# Patient Record
Sex: Female | Born: 1985 | Race: Black or African American | Hispanic: No | Marital: Married | Smoking: Never smoker
Health system: Southern US, Community
[De-identification: ages and names within clinical notes are randomized; demographics above are authoritative.]

## PROBLEM LIST (undated history)

## (undated) DIAGNOSIS — Z789 Other specified health status: Secondary | ICD-10-CM

## (undated) DIAGNOSIS — A0472 Enterocolitis due to Clostridium difficile, not specified as recurrent: Secondary | ICD-10-CM

## (undated) HISTORY — DX: Other specified health status: Z78.9

## (undated) HISTORY — PX: NO PAST SURGERIES: SHX2092

---

## 2014-05-19 ENCOUNTER — Observation Stay: Payer: Self-pay | Admitting: Obstetrics and Gynecology

## 2014-05-20 ENCOUNTER — Inpatient Hospital Stay: Payer: Self-pay

## 2014-07-16 NOTE — H&P (Signed)
L&D Evaluation:  History:  Presents with contractions, leaking fluid   Patient's Medical History No Chronic Illness   Patient's Surgical History none   Medications Pre Natal Vitamins  Tylenol (Acetaminophen)  aciphex   Allergies NKDA   Social History none   Family History Non-Contributory   ROS:  ROS All systems were reviewed.  HEENT, CNS, GI, GU, Respiratory, CV, Renal and Musculoskeletal systems were found to be normal.   Exam:  Vital Signs stable   Urine Protein not completed   General no apparent distress   Mental Status clear   Chest clear   Heart normal sinus rhythm   Abdomen gravid, tender with contractions   Estimated Fetal Weight Average for gestational age   Fetal Position vtx   Edema no edema   Pelvic no external lesions, 6.5/80/-2   Mebranes Ruptured   Description bloody   FHT normal rate with no decels   Fetal Heart Rate 144   Ucx regular   Ucx Frequency 3 min   Length of each Contraction 50 seconds   Ucx Pain Scale 8   Skin dry   Impression:  Impression active labor   Plan:  Plan monitor contractions and for cervical change, fluids, epidural   Electronic Signatures: Trudee Kuster, Melody N (CNM)  (Signed 14-Mar-16 21:58)  Authored: L&D Evaluation   Last Updated: 14-Mar-16 21:58 by Evonnie Pat (CNM)

## 2014-11-04 ENCOUNTER — Encounter: Payer: Self-pay | Admitting: *Deleted

## 2014-11-07 LAB — HM PAP SMEAR: HM Pap smear: NORMAL

## 2014-11-08 ENCOUNTER — Encounter: Payer: Self-pay | Admitting: Obstetrics and Gynecology

## 2014-11-12 ENCOUNTER — Encounter: Payer: Self-pay | Admitting: Obstetrics and Gynecology

## 2014-11-12 ENCOUNTER — Other Ambulatory Visit: Payer: Self-pay | Admitting: Obstetrics and Gynecology

## 2014-11-12 ENCOUNTER — Ambulatory Visit (INDEPENDENT_AMBULATORY_CARE_PROVIDER_SITE_OTHER): Payer: Managed Care, Other (non HMO) | Admitting: Obstetrics and Gynecology

## 2014-11-12 VITALS — BP 111/83 | HR 81 | Ht 63.0 in | Wt 129.6 lb

## 2014-11-12 DIAGNOSIS — N911 Secondary amenorrhea: Secondary | ICD-10-CM

## 2014-11-12 DIAGNOSIS — N941 Dyspareunia: Secondary | ICD-10-CM | POA: Diagnosis not present

## 2014-11-12 DIAGNOSIS — Z01419 Encounter for gynecological examination (general) (routine) without abnormal findings: Secondary | ICD-10-CM | POA: Diagnosis not present

## 2014-11-12 DIAGNOSIS — IMO0002 Reserved for concepts with insufficient information to code with codable children: Secondary | ICD-10-CM

## 2014-11-12 MED ORDER — NORETHINDRONE 0.35 MG PO TABS: 1.0000 | ORAL_TABLET | Freq: Every day | ORAL | Status: DC

## 2014-11-12 NOTE — Patient Instructions (Addendum)
Place annual gynecologic exam patient instructions here.  Thank you for enrolling in [facility]. Please follow the instructions below to securely access your online medical record. MyChart allows you to send messages to your doctor, view your test results, manage appointments, and more.   How Do I Sign Up? 1. In your Internet browser, go to [employer] and enter [email or web address]. 2. Click on the Sign Up Now link in the Sign In box. You will see the New Member Sign Up page. 3. Enter your MyChart Access Code exactly as it appears below. You will not need to use this code after you've completed the sign-up process. If you do not sign up before the expiration date, you must request a new code.  MyChart Access Code: [email or web address] Expires: 01/11/2015  8:48 AM  4. Enter your Social Security Number ([id number]) and Date of Birth (mm/dd/yyyy) as indicated and click Submit. You will be taken to the next sign-up page. 5. Create a MyChart ID. This will be your MyChart login ID and cannot be changed, so think of one that is secure and easy to remember. 6. Create a MyChart password. You can change your password at any time. 7. Enter your Password Reset Question and Answer. This can be used at a later time if you forget your password.  8. Enter your e-mail address. You will receive e-mail notification when new information is available in [facility]. 9. Click Sign Up. You can now view your medical record.   Additional Information Remember, MyChart is NOT to be used for urgent needs. For medical emergencies, dial 911.   Dyspareunia Dyspareunia is pain during sexual intercourse. It is most common in women, but it also happens in men.  CAUSES  Female The pain from this condition is usually felt when anything is put into the vagina, but any part of the genitals may cause pain during sex. Even sitting or wearing pants can cause pain. Sometimes, a cause cannot be found. Some causes of pain  during intercourse are:  Infections of the skin around the vagina.  Vaginal infections, such as a yeast, bacterial, or viral infection.  Vaginismus. This is the inability to have anything put in the vagina even when the woman wants it to happen. There is an automatic muscle contraction and pain. The pain of the muscle contraction can be so severe that intercourse is impossible.  Allergic reaction from spermicides, semen, condoms, scented tampons, soaps, douches, and vaginal sprays.  A fluid-filled sac (cyst) on the Bartholin or Skene glands, located at the opening of the vagina.  Scar tissue in the vagina from a surgically enlarged opening (episiotomy) or tearing after delivering a baby.  Vaginal dryness. This is more common in menopause. The normal secretions of the vagina are decreased. Changes in estrogen levels and increased difficulty becoming aroused can cause painful sex. Vaginal dryness can also happen when taking birth control pills.  Thinning of the tissue (atrophy) of the vulva and vagina. This makes the area thinner, smaller, unable to stretch to accommodate a penis, and prone to infection and tearing.  Vulvar vestibulitis or vestibulodynia.This is a condition that causes pain involving the area around the entrance to the vagina.The most common cause in young women is birth control pills.Women with low estrogen levels (postmenopausal women) may also experience this.Other causes include allergic reactions, too many nerve endings, skin conditions, and pelvic muscles that cannot relax.  Vulvar dermatoses. This includes skin conditions such as lichen sclerosus and lichen planus.  Lack of  foreplay to lubricate the vagina. This can cause vaginal dryness.  Noncancerous tumors (fibroids) in the uterus.  Uterus lining tissue growing outside the uterus (endometriosis).  Pregnancy that starts in the fallopian tube (tubal pregnancy).  Pregnancy or breastfeeding your baby. This can  cause vaginal dryness.  A tilting or prolapse of the uterus. Prolapse is when weak and stretched muscles around the uterus allow it to fall into the vagina.  Problems with the ovaries, cysts, or scar tissue. This may be worse with certain sexual positions.  Previous surgeries causing adhesions or scar tissue in the vagina or pelvis.  Bladder and intestinal problems.  Psychological problems (such as depression or anxiety). This may make pain worse.  Negative attitudes about sex, experiencing rape, sexual assault, and misinformation about sex. These issues are often related to some types of pain.  Previous pelvic infection, causing scar tissue in the pelvis and on the female organs.  Cyst or tumor on the ovary.  Cancer of the female organs.  Certain medicines.  Medical problems such as diabetes, arthritis, or thyroid disease. Female In men, there are many physical causes of sexual discomfort. Some causes of pain during intercourse are:  Infections of the prostate, bladder, or seminal vesicles. This can cause pain after ejaculation.  An inflamed bladder (interstitial cystitis). This may cause pain from ejaculation.  Gonorrheal infections. This may cause pain during ejaculation.  An inflamed urethra (urethritis) or inflamed prostate (prostatitis). This can make genital stimulation painful or uncomfortable.  Deformities of the penis, such as Peyronie's disease.  A tight foreskin.  Cancer of the female organs.  Psychological problems. This may make pain worse. DIAGNOSIS   Your caregiver will take a history and have you describe where the pain is located (outside the vagina, in the vagina, in the pelvis). You may be asked when you experience pain, such as with penetration or with thrusting.  Following this, your caregiver will do a physical exam. Let your caregiver know if the exam is too painful.  During the final part of the female exam, your caregiver will feel your uterus and  ovaries with one hand on the abdomen and one finger in your vagina. This is a pelvic exam.  Blood tests, a Pap test, cultures for infection, an ultrasound test, and X-rays may be done. You may need to see a specialist for female problems (gynecologist).  Your caregiver may do a CT scan, MRI, or laparoscopy. Laparoscopy is a procedure to look into the pelvis with a lighted tube, through a cut (incision) in the abdomen. TREATMENT  Your caregiver can help you determine the best course of treatment. Sometimes, more testing is done. Continue with the suggested testing until your caregiver feels sure about your diagnosis and how to treat it. Sometimes, it is difficult to find the reason for the pain. The search for the cause and treatment can be frustrating. Treatment often takes several weeks to a few months before you notice any improvement. You may also need to avoid sexual activity until symptoms improve.Continuing to have sex when it hurts can delay healing and actually make the problem worse. The treatment depends on the cause of the pain. Treatment may include:  Medicines such as antibiotics, vaginal or skin creams, hormones, or antidepressants.  Minor or major surgery.  Psychological counseling or group therapy.  Kegel exercises and vaginal dilators to help certain cases of vaginismus (spasms). Do this only if recommended by your caregiver.Kegel exercises can make some problems worse.  Applying  lubrication as recommended by your caregiver if you have dryness.  Sex therapy for you and your sex partner. It is common for the pain to continue after the reason for the pain has been treated. Some reasons for this include a conditioned response. This means the person having the pain becomes so familiar with the pain that the pain continues as a response, even though the cause is removed. Sex therapy can help with this problem. HOME CARE INSTRUCTIONS   Follow your caregiver's instructions about  taking medicines, tests, counseling, and follow-up treatment.  Do not use scented tampons, douches, vaginal sprays, or soaps.  Use water-based lubricants for dryness. Oil lubricants can cause irritation.  Do not use spermicides or condoms that irritate you.  Openly discuss with your partner your sexual experience, your desires, foreplay, and different sexual positions for a more comfortable and enjoyable sexual relationship.  Join group sessions for therapy, if needed.  Practice safe sex at all times.  Empty your bladder before having intercourse.  Try different positions during sexual intercourse.  Take over-the-counter pain medicine recommended by your caregiver before having sexual intercourse.  Do not wear pantyhose. Knee-high and thigh-high hose are okay.  Avoid scrubbing your vulva with a washcloth. Wash the area gently and pat dry with a towel. SEEK MEDICAL CARE IF:   You develop vaginal bleeding after sexual intercourse.  You develop a lump at the opening of your vagina, even if it is not painful.  You have abnormal vaginal discharge.  You have vaginal dryness.  You have itching or irritation of the vulva or vagina.  You develop a rash or reaction to your medicine. SEEK IMMEDIATE MEDICAL CARE IF:   You develop severe abdominal pain during or shortly after sexual intercourse. You could have a ruptured ovarian cyst or ruptured tubal pregnancy.  You have a fever.  You have painful or bloody urination.  You have painful sexual intercourse, and you never had it before.  You pass out after having sexual intercourse. Document Released: 03/14/2007 Document Revised: 05/17/2011 Document Reviewed: 05/25/2010 [facility] Patient Information 2015 [place], [place]. This information is not intended to replace advice given to you by your health care provider. Make sure you discuss any questions you have with your health care provider.

## 2014-11-12 NOTE — Progress Notes (Signed)
  Subjective:     Renee Reyes is a 29 y.o. female and is here for a comprehensive physical exam. The patient reports pain at introitus with sex since delivery of infant, amenorrhea secondary to breastfeeding..  Social History   Social History  . Marital Status: Married    Spouse Name: N/A  . Number of Children: N/A  . Years of Education: N/A   Occupational History  . Not on file.   Social History Main Topics  . Smoking status: Never Smoker   . Smokeless tobacco: Never Used  . Alcohol Use: No  . Drug Use: No  . Sexual Activity: Yes    Birth Control/ Protection: Pill   Other Topics Concern  . Not on file   Social History Narrative   Health Maintenance  Topic Date Due  . HIV Screening  06/13/2002  . TETANUS/TDAP  06/13/2006  . PAP SMEAR  06/12/2008  . INFLUENZA VACCINE  10/07/2014    The following portions of the patient's history were reviewed and updated as appropriate: allergies, current medications, past family history, past medical history, past social history, past surgical history and problem list.  Review of Systems A comprehensive review of systems was negative.   Objective:    General appearance: alert, cooperative and appears stated age Neck: no adenopathy, no carotid bruit, no JVD, supple, symmetrical, trachea midline and thyroid not enlarged, symmetric, no tenderness/mass/nodules Lungs: clear to auscultation bilaterally Breasts: normal appearance, no masses or tenderness Heart: regular rate and rhythm, S1, S2 normal, no murmur, click, rub or gallop Abdomen: soft, non-tender; bowel sounds normal; no masses,  no organomegaly Pelvic: cervix normal in appearance, external genitalia normal, no adnexal masses or tenderness, no cervical motion tenderness, rectovaginal septum normal, uterus normal size, shape, and consistency and vagina normal without discharge    Assessment:    Healthy female exam. 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    See After Visit Summary for Counseling Recommendations

## 2014-11-13 LAB — CYTOLOGY - PAP

## 2014-11-14 ENCOUNTER — Encounter: Payer: Self-pay | Admitting: Obstetrics and Gynecology

## 2014-11-14 ENCOUNTER — Encounter: Payer: Self-pay | Admitting: *Deleted

## 2015-02-11 ENCOUNTER — Telehealth: Payer: Self-pay | Admitting: Obstetrics and Gynecology

## 2015-02-11 NOTE — Telephone Encounter (Signed)
Pt called and for the past 10 days she has had diarrhea and cramping, she is still breast feeding and she has not had a period since she has had the baby, she had her baby march 15, she wanted to know what she can take for the diarrhea while she is still breast feeding and if its normal about not having her period for this long.

## 2015-02-12 NOTE — Telephone Encounter (Signed)
Spoke w/pt. °

## 2015-02-12 NOTE — Telephone Encounter (Signed)
pls advise

## 2015-02-12 NOTE — Telephone Encounter (Signed)
OTC immodium is safe, and yes, most women do not have a menstrual cycle while breastfeeding.

## 2015-02-15 ENCOUNTER — Emergency Department
Admission: EM | Admit: 2015-02-15 | Discharge: 2015-02-15 | Disposition: A | Discharge status: Home / Self Care | Payer: Managed Care, Other (non HMO) | Attending: Emergency Medicine | Admitting: Emergency Medicine

## 2015-02-15 DIAGNOSIS — Z79899 Other long term (current) drug therapy: Secondary | ICD-10-CM | POA: Insufficient documentation

## 2015-02-15 DIAGNOSIS — A047 Enterocolitis due to Clostridium difficile: Secondary | ICD-10-CM | POA: Diagnosis not present

## 2015-02-15 DIAGNOSIS — R1084 Generalized abdominal pain: Secondary | ICD-10-CM

## 2015-02-15 DIAGNOSIS — R109 Unspecified abdominal pain: Secondary | ICD-10-CM | POA: Diagnosis present

## 2015-02-15 DIAGNOSIS — Z3202 Encounter for pregnancy test, result negative: Secondary | ICD-10-CM | POA: Diagnosis not present

## 2015-02-15 DIAGNOSIS — A0472 Enterocolitis due to Clostridium difficile, not specified as recurrent: Secondary | ICD-10-CM

## 2015-02-15 LAB — COMPREHENSIVE METABOLIC PANEL
ALBUMIN: 4 g/dL (ref 3.5–5.0)
ALK PHOS: 84 U/L (ref 38–126)
ALT: 21 U/L (ref 14–54)
AST: 25 U/L (ref 15–41)
Anion gap: 6 (ref 5–15)
BILIRUBIN TOTAL: 1 mg/dL (ref 0.3–1.2)
BUN: 8 mg/dL (ref 6–20)
CALCIUM: 9 mg/dL (ref 8.9–10.3)
CO2: 25 mmol/L (ref 22–32)
Chloride: 105 mmol/L (ref 101–111)
Creatinine, Ser: 0.64 mg/dL (ref 0.44–1.00)
GLUCOSE: 118 mg/dL — AB (ref 65–99)
Potassium: 3.5 mmol/L (ref 3.5–5.1)
SODIUM: 136 mmol/L (ref 135–145)
Total Protein: 7.2 g/dL (ref 6.5–8.1)

## 2015-02-15 LAB — URINALYSIS COMPLETE WITH MICROSCOPIC (ARMC ONLY)
Bilirubin Urine: NEGATIVE
GLUCOSE, UA: NEGATIVE mg/dL
Hgb urine dipstick: NEGATIVE
Leukocytes, UA: NEGATIVE
Nitrite: NEGATIVE
Protein, ur: NEGATIVE mg/dL
Specific Gravity, Urine: 1.004 — ABNORMAL LOW (ref 1.005–1.030)
pH: 6 (ref 5.0–8.0)

## 2015-02-15 LAB — CBC
HEMATOCRIT: 41 % (ref 35.0–47.0)
HEMOGLOBIN: 13.8 g/dL (ref 12.0–16.0)
MCH: 29.1 pg (ref 26.0–34.0)
MCHC: 33.8 g/dL (ref 32.0–36.0)
MCV: 86.1 fL (ref 80.0–100.0)
Platelets: 272 10*3/uL (ref 150–440)
RBC: 4.76 MIL/uL (ref 3.80–5.20)
RDW: 13.6 % (ref 11.5–14.5)
WBC: 14.3 10*3/uL — ABNORMAL HIGH (ref 3.6–11.0)

## 2015-02-15 LAB — POCT PREGNANCY, URINE: PREG TEST UR: NEGATIVE

## 2015-02-15 LAB — LIPASE, BLOOD: LIPASE: 17 U/L (ref 11–51)

## 2015-02-15 MED ORDER — ACETAMINOPHEN 325 MG PO TABS
650.0000 mg | ORAL_TABLET | Freq: Once | ORAL | Status: AC
  Administered 2015-02-15: 650 mg via ORAL
  Filled 2015-02-15: qty 2

## 2015-02-15 NOTE — ED Provider Notes (Signed)
[facility] Emergency Department Provider Note    ____________________________________________  Time seen: 1400  I have reviewed the triage vital signs and the nursing notes.   HISTORY  Chief Complaint Abdominal Pain and Diarrhea   History limited by: Not Limited   HPI Renee Reyes is a 29 y.o. female who presents to the emergency department today because of concerns for abdominal pain in the setting of C. difficile. The patient states that she was recently diagnosed with C. difficile. She states she's been having multiple episodes of loose stool. She states that she has been having abdominal pain for roughly 1 week. It has gotten worse in the past couple of days. She has not tried taking any medication for the pain given that she is breast-feeding. She states she has been on vancomycin for roughly 24 hours at this point. She denies any fevers.     No past medical history on file.  There are no active problems to display for this patient.   No past surgical history on file.  Current Outpatient Rx  Name  Route  Sig  Dispense  Refill  . norethindrone (MICRONOR,CAMILA,ERRIN) 0.35 MG tablet   Oral   Take 1 tablet (0.35 mg total) by mouth daily.   3 Package   3     Allergies Review of patient's allergies indicates no known allergies.  Family History  Problem Relation Age of Onset  . Diabetes Mother     Social History Social History  Substance Use Topics  . Smoking status: Never Smoker   . Smokeless tobacco: Never Used  . Alcohol Use: No    Review of Systems  Constitutional: Negative for fever. Cardiovascular: Negative for chest pain. Respiratory: Negative for shortness of breath. Gastrointestinal: Positive for abdominal pain and diarrhea Genitourinary: Negative for dysuria. Musculoskeletal: Negative for back pain. Skin: Negative for rash. Neurological: Negative for headaches, focal weakness or numbness.  10-point ROS  otherwise negative.  ____________________________________________   PHYSICAL EXAM:  VITAL SIGNS: ED Triage Vitals  Enc Vitals Group     BP 02/15/15 1320 117/78 mmHg     Pulse Rate 02/15/15 1320 77     Resp 02/15/15 1320 16     Temp 02/15/15 1320 98.6 F (37 C)     Temp Source 02/15/15 1320 Oral     SpO2 02/15/15 1320 99 %     Weight 02/15/15 1320 129 lb (58.514 kg)     Height 02/15/15 1320 5\' 3"  (1.6 m)     Head Cir --      Peak Flow --      Pain Score 02/15/15 1343 8   Constitutional: Alert and oriented. Well appearing and in no distress. Eyes: Conjunctivae are normal. PERRL. Normal extraocular movements. ENT   Head: Normocephalic and atraumatic.   Nose: No congestion/rhinnorhea.   Mouth/Throat: Mucous membranes are moist.   Neck: No stridor. Hematological/Lymphatic/Immunilogical: No cervical lymphadenopathy. Cardiovascular: Normal rate, regular rhythm.  No murmurs, rubs, or gallops. Respiratory: Normal respiratory effort without tachypnea nor retractions. Breath sounds are clear and equal bilaterally. No wheezes/rales/rhonchi. Gastrointestinal: Soft. No tenderness to percussion. Mild diffuse tenderness to deep palpation. No rebound. No guarding. Genitourinary: Deferred Musculoskeletal: Normal range of motion in all extremities. No joint effusions.  No lower extremity tenderness nor edema. Neurologic:  Normal speech and language. No gross focal neurologic deficits are appreciated.  Skin:  Skin is warm, dry and intact. No rash noted. Psychiatric: Mood and affect are normal. Speech and behavior are  normal. Patient exhibits appropriate insight and judgment.  ____________________________________________    LABS (pertinent positives/negatives)  Labs Reviewed  COMPREHENSIVE METABOLIC PANEL - Abnormal; Notable for the following:    Glucose, Bld 118 (*)    All other components within normal limits  CBC - Abnormal; Notable for the following:    WBC 14.3 (*)     All other components within normal limits  URINALYSIS COMPLETEWITH MICROSCOPIC ([facility] ONLY) - Abnormal; Notable for the following:    Color, Urine STRAW (*)    APPearance CLEAR (*)    Ketones, ur TRACE (*)    Specific Gravity, Urine 1.004 (*)    Bacteria, UA RARE (*)    Squamous Epithelial / LPF 6-30 (*)    All other components within normal limits  LIPASE, BLOOD  POCT PREGNANCY, URINE     ____________________________________________   EKG  None  ____________________________________________    RADIOLOGY  None   ____________________________________________   PROCEDURES  Procedure(s) performed: None  Critical Care performed: No  ____________________________________________   INITIAL IMPRESSION / ASSESSMENT AND PLAN / ED COURSE  Pertinent labs & imaging results that were available during my care of the patient were reviewed by me and considered in my medical decision making (see chart for details).  Patient presents to the emergency department today with concerns for abdominal pain in the setting of a recently diagnosed with Clostridium difficile infection. Blood work here with mild leukocytosis. Urine without signs of infection. The patient's abdomen is soft and somewhat diffusely tender to palpation however no signs of peritonitis. At this point I think likely patient symptoms secondary to the Clostridium difficile infection. She is already on vancomycin and instructed patient to continue this. Additionally instructed patient that she could take some Tylenol helped the discomfort. She does state that the Tylenol given your did help with the pain. I discussed return precautions with the patient.  ____________________________________________   FINAL CLINICAL IMPRESSION(S) / ED DIAGNOSES  Final diagnoses:  Generalized abdominal pain  Clostridium difficile colitis     Nance Pear, MD 02/15/15 1535

## 2015-02-15 NOTE — Discharge Instructions (Signed)
Please seek medical attention for any high fevers, chest pain, shortness of breath, change in behavior, persistent vomiting, bloody stool or any other new or concerning symptoms.   Clostridium Difficile Infection Clostridium difficile (C. difficile or C. diff) is a bacterium normally found in the intestinal tract or colon. C. difficile infection causes diarrhea and sometimes a severe disease called pseudomembranous colitis (C. difficile colitis). C. difficile colitis can damage the lining of the colon or cause the colon to become very large (toxic megacolon). Older adults and people with certain medical conditions have a greater risk of getting C. difficile infections. CAUSES The balance of bacteria in your colon can change when you are sick, especially when taking antibiotic medicine. Taking antibiotics may allow the C. difficile to grow, multiply, and make a toxin that causes C. difficile infection.  SYMPTOMS  Diarrhea.  Fever.  Fatigue.  Loss of appetite.  Nausea.  Abdominal swelling, pain, or tenderness.  Dehydration. DIAGNOSIS Your health care provider may suspect C. difficile infection based on your symptoms and if you have taken antibiotics recently. Your health care provider may also order:  A lab test that can detect the toxin in your stool.  A sigmoidoscopy or colonoscopy to look at the appearance of your colon. These procedures involve passing an instrument through your rectum to look at the inside of your colon. Your health care provider will help determine if these tests are necessary. TREATMENT Treatment may include:  Taking antibiotics that keep C. difficile from growing.  Stopping the antibiotics you were on before the C. difficile infection began. Only do this if instructed to do so by your health care provider.  IV fluids and correction of electrolyte imbalance.  Surgery to remove the infected part of the intestines. This is rare. HOME CARE  INSTRUCTIONS  Drink enough fluids to keep your urine clear or pale yellow. Avoid milk, caffeine, and alcohol.  Ask your health care provider for specific rehydration instructions.  Eat small, frequent meals rather than large meals.  Take your antibiotics as directed. Finish them even if you start to feel better.  Do not use medicines to slow diarrhea. This could delay healing or cause problems.  Wash your hands thoroughly after using the bathroom and before preparing food. Make sure people who live with you wash their hands often, too.  Clean all surfaces with a product that contains chlorine bleach. SEEK MEDICAL CARE IF:  Your diarrhea lasts longer than expected or comes back after you finish your antibiotic medicine for the C. difficile infection.  You have trouble staying hydrated.  You have a fever. SEEK IMMEDIATE MEDICAL CARE IF:  You have increasing abdominal pain or tenderness.  You have blood in your stools, or your stools look dark black and tarry.  You cannot eat or drink without vomiting.   This information is not intended to replace advice given to you by your health care provider. Make sure you discuss any questions you have with your health care provider.   Document Released: 12/02/2004 Document Revised: 03/15/2014 Document Reviewed: 08/26/2014 Elsevier Interactive Patient Education [employer].

## 2015-02-15 NOTE — ED Notes (Signed)
Pt verbalized understanding of discharge instructions. NAD at this time. 

## 2015-02-15 NOTE — ED Notes (Signed)
Patient presents to the ED with abdominal pain.  Patient states she has had diarrhea for 2 weeks and has been diagnosed with c-diff.  Patient has been on vancomycin for about 24 hours.  Patient states she has had abdominal pain x 1 week but it has gotten much worse in the last day.  Patient reports diarrheal episodes too numerous to count in the past 24 hours.  Patient ambulatory to triage, no obvious distress at this time.

## 2015-05-26 ENCOUNTER — Other Ambulatory Visit: Payer: Self-pay | Admitting: Obstetrics and Gynecology

## 2015-05-26 ENCOUNTER — Telehealth: Payer: Self-pay | Admitting: Obstetrics and Gynecology

## 2015-05-26 MED ORDER — NORETHIN ACE-ETH ESTRAD-FE 1-20 MG-MCG PO TABS: 1.0000 | ORAL_TABLET | Freq: Every day | ORAL | Status: DC

## 2015-05-26 NOTE — Telephone Encounter (Signed)
Pt has stopped nursing her baby, so she needs her BC oral contraceptive switched back to what used before nursing.  ([facility])

## 2015-10-06 ENCOUNTER — Ambulatory Visit: Payer: Managed Care, Other (non HMO) | Admitting: Obstetrics and Gynecology

## 2015-10-16 ENCOUNTER — Ambulatory Visit (INDEPENDENT_AMBULATORY_CARE_PROVIDER_SITE_OTHER): Payer: BLUE CROSS/BLUE SHIELD | Admitting: Obstetrics and Gynecology

## 2015-10-16 ENCOUNTER — Encounter: Payer: Self-pay | Admitting: Obstetrics and Gynecology

## 2015-10-16 VITALS — BP 127/82 | HR 94 | Ht 63.0 in | Wt 124.6 lb

## 2015-10-16 DIAGNOSIS — N926 Irregular menstruation, unspecified: Secondary | ICD-10-CM

## 2015-10-16 LAB — POCT URINE PREGNANCY: PREG TEST UR: POSITIVE — AB

## 2015-10-16 NOTE — Patient Instructions (Signed)
Thank you for enrolling in [facility]. Please follow the instructions below to securely access your online medical record. MyChart allows you to send messages to your doctor, view your test results, renew your prescriptions, schedule appointments, and more.  How Do I Sign Up? 1. In your Internet browser, go to [email or web address]. 2. Click on the New  User? link in the Sign In box.  3. Enter your MyChart Access Code exactly as it appears below. You will not need to use this code after you have completed the sign-up process. If you do not sign up before the expiration date, you must request a new code. MyChart Access Code: [id number] Expires: 12/15/2015  3:16 PM  4. Enter the last four digits of your Social Security Number (xxxx) and Date of Birth (mm/dd/yyyy) as indicated and click Next. You will be taken to the next sign-up page. 5. Create a MyChart ID. This will be your MyChart login ID and cannot be changed, so think of one that is secure and easy to remember. 6. Create a MyChart password. You can change your password at any time. 7. Enter your Password Reset Question and Answer and click Next. This can be used at a later time if you forget your password.  8. Select your communication preference, and if applicable enter your e-mail address. You will receive e-mail notification when new information is available in MyChart by choosing to receive e-mail notifications and filling in your e-mail. 9. Click Sign In. You can now view your medical record.   Additional Information If you have questions, you can email [email or web address] or call [phone] to talk to our [facility] staff. Remember, MyChart is NOT to be used for urgent needs. For medical emergencies, dial 911.   First Trimester of Pregnancy The first trimester of pregnancy is from week 1 until the end of week 12 (months 1 through 3). A week after a sperm fertilizes an egg, the egg will implant on the wall  of the uterus. This embryo will begin to develop into a baby. Genes from you and your partner are forming the baby. The female genes determine whether the baby is a boy or a girl. At 6-8 weeks, the eyes and face are formed, and the heartbeat can be seen on ultrasound. At the end of 12 weeks, all the baby's organs are formed.  Now that you are pregnant, you will want to do everything you can to have a healthy baby. Two of the most important things are to get good prenatal care and to follow your health care provider's instructions. Prenatal care is all the medical care you receive before the baby's birth. This care will help prevent, find, and treat any problems during the pregnancy and childbirth. BODY CHANGES Your body goes through many changes during pregnancy. The changes vary from woman to woman.   You may gain or lose a couple of pounds at first.  You may feel sick to your stomach (nauseous) and throw up (vomit). If the vomiting is uncontrollable, call your health care provider.  You may tire easily.  You may develop headaches that can be relieved by medicines approved by your health care provider.  You may urinate more often. Painful urination may mean you have a bladder infection.  You may develop heartburn as a result of your pregnancy.  You may develop constipation because certain hormones are causing the muscles that push waste through your intestines to slow down.  You may develop  hemorrhoids or swollen, bulging veins (varicose veins).  Your breasts may begin to grow larger and become tender. Your nipples may stick out more, and the tissue that surrounds them (areola) may become darker.  Your gums may bleed and may be sensitive to brushing and flossing.  Dark spots or blotches (chloasma, mask of pregnancy) may develop on your face. This will likely fade after the baby is born.  Your menstrual periods will stop.  You may have a loss of appetite.  You may develop cravings for  certain kinds of food.  You may have changes in your emotions from day to day, such as being excited to be pregnant or being concerned that something may go wrong with the pregnancy and baby.  You may have more vivid and strange dreams.  You may have changes in your hair. These can include thickening of your hair, rapid growth, and changes in texture. Some women also have hair loss during or after pregnancy, or hair that feels dry or thin. Your hair will most likely return to normal after your baby is born. WHAT TO EXPECT AT YOUR PRENATAL VISITS During a routine prenatal visit:  You will be weighed to make sure you and the baby are growing normally.  Your blood pressure will be taken.  Your abdomen will be measured to track your baby's growth.  The fetal heartbeat will be listened to starting around week 10 or 12 of your pregnancy.  Test results from any previous visits will be discussed. Your health care provider may ask you:  How you are feeling.  If you are feeling the baby move.  If you have had any abnormal symptoms, such as leaking fluid, bleeding, severe headaches, or abdominal cramping.  If you are using any tobacco products, including cigarettes, chewing tobacco, and electronic cigarettes.  If you have any questions. Other tests that may be performed during your first trimester include:  Blood tests to find your blood type and to check for the presence of any previous infections. They will also be used to check for low iron levels (anemia) and Rh antibodies. Later in the pregnancy, blood tests for diabetes will be done along with other tests if problems develop.  Urine tests to check for infections, diabetes, or protein in the urine.  An ultrasound to confirm the proper growth and development of the baby.  An amniocentesis to check for possible genetic problems.  Fetal screens for spina bifida and Down syndrome.  You may need other tests to make sure you and the  baby are doing well.  HIV (human immunodeficiency virus) testing. Routine prenatal testing includes screening for HIV, unless you choose not to have this test. HOME CARE INSTRUCTIONS  Medicines  Follow your health care provider's instructions regarding medicine use. Specific medicines may be either safe or unsafe to take during pregnancy.  Take your prenatal vitamins as directed.  If you develop constipation, try taking a stool softener if your health care provider approves. Diet  Eat regular, well-balanced meals. Choose a variety of foods, such as meat or vegetable-based protein, fish, milk and low-fat dairy products, vegetables, fruits, and whole grain breads and cereals. Your health care provider will help you determine the amount of weight gain that is right for you.  Avoid raw meat and uncooked cheese. These carry germs that can cause birth defects in the baby.  Eating four or five small meals rather than three large meals a day may help relieve nausea and vomiting. If  you start to feel nauseous, eating a few soda crackers can be helpful. Drinking liquids between meals instead of during meals also seems to help nausea and vomiting.  If you develop constipation, eat more high-fiber foods, such as fresh vegetables or fruit and whole grains. Drink enough fluids to keep your urine clear or pale yellow. Activity and Exercise  Exercise only as directed by your health care provider. Exercising will help you:  Control your weight.  Stay in shape.  Be prepared for labor and delivery.  Experiencing pain or cramping in the lower abdomen or low back is a good sign that you should stop exercising. Check with your health care provider before continuing normal exercises.  Try to avoid standing for long periods of time. Move your legs often if you must stand in one place for a long time.  Avoid heavy lifting.  Wear low-heeled shoes, and practice good posture.  You may continue to have sex  unless your health care provider directs you otherwise. Relief of Pain or Discomfort  Wear a good support bra for breast tenderness.   Take warm sitz baths to soothe any pain or discomfort caused by hemorrhoids. Use hemorrhoid cream if your health care provider approves.   Rest with your legs elevated if you have leg cramps or low back pain.  If you develop varicose veins in your legs, wear support hose. Elevate your feet for 15 minutes, 3-4 times a day. Limit salt in your diet. Prenatal Care  Schedule your prenatal visits by the twelfth week of pregnancy. They are usually scheduled monthly at first, then more often in the last 2 months before delivery.  Write down your questions. Take them to your prenatal visits.  Keep all your prenatal visits as directed by your health care provider. Safety  Wear your seat belt at all times when driving.  Make a list of emergency phone numbers, including numbers for family, friends, the hospital, and police and fire departments. General Tips  Ask your health care provider for a referral to a local prenatal education class. Begin classes no later than at the beginning of month 6 of your pregnancy.  Ask for help if you have counseling or nutritional needs during pregnancy. Your health care provider can offer advice or refer you to specialists for help with various needs.  Do not use hot tubs, steam rooms, or saunas.  Do not douche or use tampons or scented sanitary pads.  Do not cross your legs for long periods of time.  Avoid cat litter boxes and soil used by cats. These carry germs that can cause birth defects in the baby and possibly loss of the fetus by miscarriage or stillbirth.  Avoid all smoking, herbs, alcohol, and medicines not prescribed by your health care provider. Chemicals in these affect the formation and growth of the baby.  Do not use any tobacco products, including cigarettes, chewing tobacco, and electronic cigarettes. If  you need help quitting, ask your health care provider. You may receive counseling support and other resources to help you quit.  Schedule a dentist appointment. At home, brush your teeth with a soft toothbrush and be gentle when you floss. SEEK MEDICAL CARE IF:   You have dizziness.  You have mild pelvic cramps, pelvic pressure, or nagging pain in the abdominal area.  You have persistent nausea, vomiting, or diarrhea.  You have a bad smelling vaginal discharge.  You have pain with urination.  You notice increased swelling in your face,  hands, legs, or ankles. SEEK IMMEDIATE MEDICAL CARE IF:   You have a fever.  You are leaking fluid from your vagina.  You have spotting or bleeding from your vagina.  You have severe abdominal cramping or pain.  You have rapid weight gain or loss.  You vomit blood or material that looks like coffee grounds.  You are exposed to [place] measles and have never had them.  You are exposed to fifth disease or chickenpox.  You develop a severe headache.  You have shortness of breath.  You have any kind of trauma, such as from a fall or a car accident.   This information is not intended to replace advice given to you by your health care provider. Make sure you discuss any questions you have with your health care provider.   Document Released: 02/16/2001 Document Revised: 03/15/2014 Document Reviewed: 01/02/2013 Elsevier Interactive Patient Education [employer].

## 2015-10-16 NOTE — Progress Notes (Signed)
Subjective:     Patient ID: Renee Reyes, female   DOB: 1985-05-17, 30 y.o.   MRN: [id number]  HPI LMP 08/22/15, EGA 8w, [facility] 05/28/16 Reports daily nausea w/o vomiting, and fatigue. Happy about second pregnancy.  Review of Systems    see above Objective:   Physical Exam A&O x4  well groomed female in no distress Blood pressure 127/82, pulse 94, height 5\' 3"  (1.6 m), weight 124 lb 9.6 oz (56.5 kg), last menstrual period 08/22/2015, not currently breastfeeding. UPT+     Assessment:     Early pregnancy Nausea     Plan:     diclegis samples given RTC 1-2 weeks for viability scan and NOB labs, and 4 weeks for NOB PE.  Lorelle Gibbs, CNM

## 2015-10-30 ENCOUNTER — Ambulatory Visit (INDEPENDENT_AMBULATORY_CARE_PROVIDER_SITE_OTHER): Payer: BLUE CROSS/BLUE SHIELD

## 2015-10-30 ENCOUNTER — Ambulatory Visit (INDEPENDENT_AMBULATORY_CARE_PROVIDER_SITE_OTHER): Payer: BLUE CROSS/BLUE SHIELD | Admitting: Obstetrics and Gynecology

## 2015-10-30 VITALS — BP 117/78 | HR 93 | Wt 129.3 lb

## 2015-10-30 DIAGNOSIS — N926 Irregular menstruation, unspecified: Secondary | ICD-10-CM | POA: Diagnosis not present

## 2015-10-30 DIAGNOSIS — Z113 Encounter for screening for infections with a predominantly sexual mode of transmission: Secondary | ICD-10-CM

## 2015-10-30 DIAGNOSIS — Z1389 Encounter for screening for other disorder: Secondary | ICD-10-CM

## 2015-10-30 DIAGNOSIS — Z36 Encounter for antenatal screening of mother: Secondary | ICD-10-CM

## 2015-10-30 DIAGNOSIS — Z369 Encounter for antenatal screening, unspecified: Secondary | ICD-10-CM

## 2015-10-30 DIAGNOSIS — Z3490 Encounter for supervision of normal pregnancy, unspecified, unspecified trimester: Secondary | ICD-10-CM

## 2015-10-30 DIAGNOSIS — Z3481 Encounter for supervision of other normal pregnancy, first trimester: Secondary | ICD-10-CM

## 2015-10-30 NOTE — Patient Instructions (Signed)
Pregnancy and Zika Virus Disease Zika virus disease, or Zika, is an illness that can spread to people from mosquitoes that carry the virus. It may also spread from person to person through infected body fluids. Zika first occurred in [place], but recently it has spread to new areas. The virus occurs in tropical climates. The location of Zika continues to change. Most people who become infected with Zika virus do not develop serious illness. However, Zika may cause birth defects in an unborn baby whose mother is infected with the virus. It may also increase the risk of miscarriage. WHAT ARE THE SYMPTOMS OF ZIKA VIRUS DISEASE? In many cases, people who have been infected with Zika virus do not develop any symptoms. If symptoms appear, they usually start about a week after the person is infected. Symptoms are usually mild. They may include:  Fever.  Rash.  Red eyes.  Joint pain. HOW DOES ZIKA VIRUS DISEASE SPREAD? The main way that Zika virus spreads is through the bite of a certain type of mosquito. Unlike most types of mosquitos, which bite only at night, the type of mosquito that carries Zika virus bites both at night and during the day. Zika virus can also spread through sexual contact, through a blood transfusion, and from a mother to her baby before or during birth. Once you have had Zika virus disease, it is unlikely that you will get it again. CAN I PASS ZIKA TO MY BABY DURING PREGNANCY? Yes, Zika can pass from a mother to her baby before or during birth. WHAT PROBLEMS CAN ZIKA CAUSE FOR MY BABY? A woman who is infected with Zika virus while pregnant is at risk of having her baby born with a condition in which the brain or head is smaller than expected (microcephaly). Babies who have microcephaly can have developmental delays, seizures, hearing problems, and vision problems. Having Zika virus disease during pregnancy can also increase the risk of miscarriage. HOW CAN ZIKA VIRUS DISEASE BE  PREVENTED? There is no vaccine to prevent Zika. The best way to prevent the disease is to avoid infected mosquitoes and avoid exposure to body fluids that can spread the virus. Avoid any possible exposure to Zika by taking the following precautions. For women and their sex partners:  Avoid traveling to high-risk areas. The locations where Zika is being reported change often. To identify high-risk areas, check the CDC travel website: www.cdc.gov/zika/geo/index.html  If you or your sex partner must travel to a high-risk area, talk with a health care provider before and after traveling.  Take all precautions to avoid mosquito bites if you live in, or travel to, any of the high-risk areas. Insect repellents are safe to use during pregnancy.  Ask your health care provider when it is safe to have sexual contact. For women:  If you are pregnant or trying to become pregnant, avoid sexual contact with persons who may have been exposed to Zika virus, persons who have possible symptoms of Zika, or persons whose history you are unsure about. If you choose to have sexual contact with someone who may have been exposed to Zika virus, use condoms correctly during the entire duration of sexual activity, every time. Do not share sexual devices, as you may be exposed to body fluids.  Ask your health care provider about when it is safe to attempt pregnancy after a possible exposure to Zika virus. WHAT STEPS SHOULD I TAKE TO AVOID MOSQUITO BITES? Take these steps to avoid mosquito bites when you are   in a high-risk area:  Wear loose clothing that covers your arms and legs.  Limit your outdoor activities.  Do not open windows unless they have window screens.  Sleep under mosquito nets.  Use insect repellent. The best insect repellents have:  DEET, picaridin, oil of lemon eucalyptus (OLE), or IR3535 in them.  Higher amounts of an active ingredient in them.  Remember that insect repellents are safe to use  during pregnancy.  Do not use OLE on children who are younger than 3 years of age. Do not use insect repellent on babies who are younger than 2 months of age.  Cover your child's stroller with mosquito netting. Make sure the netting fits snugly and that any loose netting does not cover your child's mouth or nose. Do not use a blanket as a mosquito-protection cover.  Do not apply insect repellent underneath clothing.  If you are using sunscreen, apply the sunscreen before applying the insect repellent.  Treat clothing with permethrin. Do not apply permethrin directly to your skin. Follow label directions for safe use.  Get rid of standing water, where mosquitoes may reproduce. Standing water is often found in items such as buckets, bowls, animal food dishes, and flowerpots. When you return from traveling to any high-risk area, continue taking actions to protect yourself against mosquito bites for 3 weeks, even if you show no signs of illness. This will prevent spreading Zika virus to uninfected mosquitoes. WHAT SHOULD I KNOW ABOUT THE SEXUAL TRANSMISSION OF ZIKA? People can spread Zika to their sexual partners during vaginal, anal, or oral sex, or by sharing sexual devices. Many people with Zika do not develop symptoms, so a person could spread the disease without knowing that they are infected. The greatest risk is to women who are pregnant or who may become pregnant. Zika virus can live longer in semen than it can live in blood. Couples can prevent sexual transmission of the virus by:  Using condoms correctly during the entire duration of sexual activity, every time. This includes vaginal, anal, and oral sex.  Not sharing sexual devices. Sharing increases your risk of being exposed to body fluid from another person.  Avoiding all sexual activity until your health care provider says it is safe. SHOULD I BE TESTED FOR ZIKA VIRUS? A sample of your blood can be tested for Zika virus. A pregnant  woman should be tested if she may have been exposed to the virus or if she has symptoms of Zika. She may also have additional tests done during her pregnancy, such ultrasound testing. Talk with your health care provider about which tests are recommended.   This information is not intended to replace advice given to you by your health care provider. Make sure you discuss any questions you have with your health care provider.   Document Released: 11/13/2014 Document Reviewed: 11/06/2014 Elsevier Interactive Patient Education 2016 [employer] Minor Illnesses and Medications in Pregnancy  Cold/Flu:  Sudafed for congestion- Robitussin (plain) for cough- Tylenol for discomfort.  Please follow the directions on the label.  Try not to take any more than needed.  OTC Saline nasal spray and air humidifier or cool-mist  Vaporizer to sooth nasal irritation and to loosen congestion.  It is also important to increase intake of non carbonated fluids, especially if you have a fever.  Constipation:  Colace-2 capsules at bedtime; Metamucil- follow directions on label; Senokot- 1 tablet at bedtime.  Any one of these medications can be used.  It is also   very important to increase fluids and fruits along with regular exercise.  If problem persists please call the office.  Diarrhea:  Kaopectate as directed on the label.  Eat a bland diet and increase fluids.  Avoid highly seasoned foods.  Headache:  Tylenol 1 or 2 tablets every 3-4 hours as needed  Indigestion:  Maalox, Mylanta, Tums or Rolaids- as directed on label.  Also try to eat small meals and avoid fatty, greasy or spicy foods.  Nausea with or without Vomiting:  Nausea in pregnancy is caused by increased levels of hormones in the body which influence the digestive system and cause irritation when stomach acids accumulate.  Symptoms usually subside after 1st trimester of pregnancy.  Try the following: 1. Keep saltines, graham crackers or dry toast by your bed  to eat upon awakening. 2. Don't let your stomach get empty.  Try to eat 5-6 small meals per day instead of 3 large ones. 3. Avoid greasy fatty or highly seasoned foods.  4. Take OTC Unisom 1 tablet at bed time along with OTC Vitamin B6 25-50 mg 3 times per day.    If nausea continues with vomiting and you are unable to keep down food and fluids you may need a prescription medication.  Please notify your provider.   Sore throat:  Chloraseptic spray, throat lozenges and or plain Tylenol.  Vaginal Yeast Infection:  OTC Monistat for 7 days as directed on label.  If symptoms do not resolve within a week notify provider.  If any of the above problems do not subside with recommended treatment please call the office for further assistance.   Do not take Aspirin, Advil, Motrin or Ibuprofen.  * * OTC= Over the counter Hyperemesis Gravidarum Hyperemesis gravidarum is a severe form of nausea and vomiting that happens during pregnancy. Hyperemesis is worse than morning sickness. It may cause you to have nausea or vomiting all day for many days. It may keep you from eating and drinking enough food and liquids. Hyperemesis usually occurs during the first half (the first 20 weeks) of pregnancy. It often goes away once a woman is in her second half of pregnancy. However, sometimes hyperemesis continues through an entire pregnancy.  CAUSES  The cause of this condition is not completely known but is thought to be related to changes in the body's hormones when pregnant. It could be from the high level of the pregnancy hormone or an increase in estrogen in the body.  SIGNS AND SYMPTOMS   Severe nausea and vomiting.  Nausea that does not go away.  Vomiting that does not allow you to keep any food down.  Weight loss and body fluid loss (dehydration).  Having no desire to eat or not liking food you have previously enjoyed. DIAGNOSIS  Your health care provider will do a physical exam and ask you about your  symptoms. He or she may also order blood tests and urine tests to make sure something else is not causing the problem.  TREATMENT  You may only need medicine to control the problem. If medicines do not control the nausea and vomiting, you will be treated in the hospital to prevent dehydration, increased acid in the blood (acidosis), weight loss, and changes in the electrolytes in your body that may harm the unborn baby (fetus). You may need IV fluids.  HOME CARE INSTRUCTIONS   Only take over-the-counter or prescription medicines as directed by your health care provider.  Try eating a couple of dry crackers or   toast in the morning before getting out of bed.  Avoid foods and smells that upset your stomach.  Avoid fatty and spicy foods.  Eat 5-6 small meals a day.  Do not drink when eating meals. Drink between meals.  For snacks, eat high-protein foods, such as cheese.  Eat or suck on things that have ginger in them. Ginger helps nausea.  Avoid food preparation. The smell of food can spoil your appetite.  Avoid iron pills and iron in your multivitamins until after 3-4 months of being pregnant. However, consult with your health care provider before stopping any prescribed iron pills. SEEK MEDICAL CARE IF:   Your abdominal pain increases.  You have a severe headache.  You have vision problems.  You are losing weight. SEEK IMMEDIATE MEDICAL CARE IF:   You are unable to keep fluids down.  You vomit blood.  You have constant nausea and vomiting.  You have excessive weakness.  You have extreme thirst.  You have dizziness or fainting.  You have a fever or persistent symptoms for more than 2-3 days.  You have a fever and your symptoms suddenly get worse. MAKE SURE YOU:   Understand these instructions.  Will watch your condition.  Will get help right away if you are not doing well or get worse.   This information is not intended to replace advice given to you by your  health care provider. Make sure you discuss any questions you have with your health care provider.   Document Released: 02/22/2005 Document Revised: 12/13/2012 Document Reviewed: 10/04/2012 Elsevier Interactive Patient Education 2016 [employer]. Commonly Asked Questions During Pregnancy  Cats: A parasite can be excreted in cat feces.  To avoid exposure you need to have another person empty the little box.  If you must empty the litter box you will need to wear gloves.  Wash your hands after handling your cat.  This parasite can also be found in raw or undercooked meat so this should also be avoided.  Colds, Sore Throats, Flu: Please check your medication sheet to see what you can take for symptoms.  If your symptoms are unrelieved by these medications please call the office.  Dental Work: Most any dental work Investment banker[employer] corporate recommends is permitted.  X-rays should only be taken during the first trimester if absolutely necessary.  Your abdomen should be shielded with a lead apron during all x-rays.  Please notify your provider prior to receiving any x-rays.  Novocaine is fine; gas is not recommended.  If your dentist requires a note from [place] prior to dental work please call the office and we will provide one for you.  Exercise: Exercise is an important part of staying healthy during your pregnancy.  You may continue most exercises you were accustomed to prior to pregnancy.  Later in your pregnancy you will most likely notice you have difficulty with activities requiring balance like riding a bicycle.  It is important that you listen to your body and avoid activities that put you at a higher risk of falling.  Adequate rest and staying well hydrated are a must!  If you have questions about the safety of specific activities ask your provider.    Exposure to Children with illness: Try to avoid obvious exposure; report any symptoms to [place] when noted,  If you have chicken pos, red measles or mumps, you should  be immune to these diseases.   Please do not take any vaccines while pregnant unless you have checked with  your OB provider.  Fetal Movement: After 28 weeks we recommend you do "kick counts" twice daily.  Lie or sit down in a calm quiet environment and count your baby movements "kicks".  You should feel your baby at least 10 times per hour.  If you have not felt 10 kicks within the first hour get up, walk around and have something sweet to eat or drink then repeat for an additional hour.  If count remains less than 10 per hour notify your provider.  Fumigating: Follow your pest control agent's advice as to how long to stay out of your home.  Ventilate the area well before re-entering.  Hemorrhoids:   Most over-the-counter preparations can be used during pregnancy.  Check your medication to see what is safe to use.  It is important to use a stool softener or fiber in your diet and to drink lots of liquids.  If hemorrhoids seem to be getting worse please call the office.   Hot Tubs:  Hot tubs Jacuzzis and saunas are not recommended while pregnant.  These increase your internal body temperature and should be avoided.  Intercourse:  Sexual intercourse is safe during pregnancy as long as you are comfortable, unless otherwise advised by your provider.  Spotting may occur after intercourse; report any bright red bleeding that is heavier than spotting.  Labor:  If you know that you are in labor, please go to the hospital.  If you are unsure, please call the office and let us help you decide what to do.  Lifting, straining, etc:  If your job requires heavy lifting or straining please check with your provider for any limitations.  Generally, you should not lift items heavier than that you can lift simply with your hands and arms (no back muscles)  Painting:  Paint fumes do not harm your pregnancy, but may make you ill and should be avoided if possible.  Latex or water based paints have less odor than oils.   Use adequate ventilation while painting.  Permanents & Hair Color:  Chemicals in hair dyes are not recommended as they cause increase hair dryness which can increase hair loss during pregnancy.  " Highlighting" and permanents are allowed.  Dye may be absorbed differently and permanents may not hold as well during pregnancy.  Sunbathing:  Use a sunscreen, as skin burns easily during pregnancy.  Drink plenty of fluids; avoid over heating.  Tanning Beds:  Because their possible side effects are still unknown, tanning beds are not recommended.  Ultrasound Scans:  Routine ultrasounds are performed at approximately 20 weeks.  You will be able to see your baby's general anatomy an if you would like to know the gender this can usually be determined as well.  If it is questionable when you conceived you may also receive an ultrasound early in your pregnancy for dating purposes.  Otherwise ultrasound exams are not routinely performed unless there is a medical necessity.  Although you can request a scan we ask that you pay for it when conducted because insurance does not cover " patient request" scans.  Work: If your pregnancy proceeds without complications you may work until your due date, unless your physician or employer advises otherwise.  Round Ligament Pain/Pelvic Discomfort:  Sharp, shooting pains not associated with bleeding are fairly common, usually occurring in the second trimester of pregnancy.  They tend to be worse when standing up or when you remain standing for long periods of time.  These are the result  of pressure of certain pelvic ligaments called "round ligaments".  Rest, Tylenol and heat seem to be the most effective relief.  As the womb and fetus grow, they rise out of the pelvis and the discomfort improves.  Please notify the office if your pain seems different than that described.  It may represent a more serious condition.   

## 2015-10-30 NOTE — Progress Notes (Signed)
Renee Reyes presents for NOB nurse interview visit. G-2.  P-1001. Pregnancy confirmation by Lorelle Gibbs, CNM on 10/16/15. Pregnancy education material explained and given. No cats in the home. NOB labs ordered.  HIV labs and Drug screen were explained optional and she could opt out of tests but did not decline. Drug screen ordered. PNV encouraged. Pt does not desire any genetic testing.  Pt already scheduled on 10/16/15 for NOB physical.  All questions answered.

## 2015-10-31 LAB — ABO

## 2015-10-31 LAB — CBC WITH DIFFERENTIAL/PLATELET
BASOS ABS: 0 10*3/uL (ref 0.0–0.2)
Basos: 0 %
EOS (ABSOLUTE): 0.1 10*3/uL (ref 0.0–0.4)
EOS: 1 %
HEMATOCRIT: 39.2 % (ref 34.0–46.6)
HEMOGLOBIN: 13.2 g/dL (ref 11.1–15.9)
IMMATURE GRANS (ABS): 0 10*3/uL (ref 0.0–0.1)
Immature Granulocytes: 0 %
LYMPHS: 19 %
Lymphocytes Absolute: 2.1 10*3/uL (ref 0.7–3.1)
MCH: 29.2 pg (ref 26.6–33.0)
MCHC: 33.7 g/dL (ref 31.5–35.7)
MCV: 87 fL (ref 79–97)
MONOCYTES: 8 %
Monocytes Absolute: 0.9 10*3/uL (ref 0.1–0.9)
Neutrophils Absolute: 7.7 10*3/uL — ABNORMAL HIGH (ref 1.4–7.0)
Neutrophils: 72 %
Platelets: 265 10*3/uL (ref 150–379)
RBC: 4.52 x10E6/uL (ref 3.77–5.28)
RDW: 14.3 % (ref 12.3–15.4)
WBC: 10.8 10*3/uL (ref 3.4–10.8)

## 2015-10-31 LAB — MICROSCOPIC EXAMINATION
CASTS: NONE SEEN /LPF
Epithelial Cells (non renal): >10 /hpf — AB (ref 0–10)

## 2015-10-31 LAB — GC/CHLAMYDIA PROBE AMP
CHLAMYDIA, DNA PROBE: NEGATIVE
NEISSERIA GONORRHOEAE BY PCR: NEGATIVE

## 2015-10-31 LAB — MONITOR DRUG PROFILE 14(MW)
AMPHETAMINE SCREEN URINE: NEGATIVE ng/mL
BARBITURATE SCREEN URINE: NEGATIVE ng/mL
BENZODIAZEPINE SCREEN, URINE: NEGATIVE ng/mL
Buprenorphine, Urine: NEGATIVE ng/mL
CANNABINOIDS UR QL SCN: NEGATIVE ng/mL
Cocaine (Metab) Scrn, Ur: NEGATIVE ng/mL
Creatinine(Crt), U: 169.8 mg/dL (ref 20.0–300.0)
Fentanyl, Urine: NEGATIVE pg/mL
Meperidine Screen, Urine: NEGATIVE ng/mL
Methadone Screen, Urine: NEGATIVE ng/mL
OXYCODONE+OXYMORPHONE UR QL SCN: NEGATIVE ng/mL
Opiate Scrn, Ur: NEGATIVE ng/mL
PH UR, DRUG SCRN: 5.8 (ref 4.5–8.9)
Phencyclidine Qn, Ur: NEGATIVE ng/mL
Propoxyphene Scrn, Ur: NEGATIVE ng/mL
SPECIFIC GRAVITY: 1.021
Tramadol Screen, Urine: NEGATIVE ng/mL

## 2015-10-31 LAB — URINALYSIS, ROUTINE W REFLEX MICROSCOPIC
Bilirubin, UA: NEGATIVE
Ketones, UA: NEGATIVE
Nitrite, UA: NEGATIVE
PH UA: 6 (ref 5.0–7.5)
RBC, UA: NEGATIVE
Specific Gravity, UA: >=1.03 — AB (ref 1.005–1.030)
UUROB: 0.2 mg/dL (ref 0.2–1.0)

## 2015-10-31 LAB — RUBELLA SCREEN: RUBELLA: 1.36 {index} (ref >0.99)

## 2015-10-31 LAB — HIV ANTIBODY (ROUTINE TESTING W REFLEX): HIV SCREEN 4TH GENERATION: NONREACTIVE

## 2015-10-31 LAB — RPR: RPR Ser Ql: NONREACTIVE

## 2015-10-31 LAB — HEPATITIS B SURFACE ANTIGEN: HEP B S AG: NEGATIVE

## 2015-10-31 LAB — VARICELLA ZOSTER ANTIBODY, IGG: VARICELLA: 895 {index} (ref >165)

## 2015-10-31 LAB — NICOTINE SCREEN, URINE: COTININE UR QL SCN: NEGATIVE ng/mL

## 2015-10-31 LAB — RH TYPE: RH TYPE: POSITIVE

## 2015-10-31 LAB — SICKLE CELL SCREEN: Sickle Cell Screen: NEGATIVE

## 2015-10-31 LAB — ANTIBODY SCREEN: ANTIBODY SCREEN: NEGATIVE

## 2015-11-01 LAB — URINE CULTURE, OB REFLEX

## 2015-11-01 LAB — CULTURE, OB URINE

## 2015-11-14 ENCOUNTER — Encounter: Payer: BLUE CROSS/BLUE SHIELD | Admitting: Obstetrics and Gynecology

## 2015-11-21 ENCOUNTER — Ambulatory Visit (INDEPENDENT_AMBULATORY_CARE_PROVIDER_SITE_OTHER): Payer: BLUE CROSS/BLUE SHIELD | Admitting: Obstetrics and Gynecology

## 2015-11-21 VITALS — BP 121/66 | HR 95 | Wt 131.2 lb

## 2015-11-21 DIAGNOSIS — Z23 Encounter for immunization: Secondary | ICD-10-CM | POA: Diagnosis not present

## 2015-11-21 DIAGNOSIS — Z3492 Encounter for supervision of normal pregnancy, unspecified, second trimester: Secondary | ICD-10-CM

## 2015-11-21 LAB — POCT URINALYSIS DIPSTICK
Bilirubin, UA: NEGATIVE
Blood, UA: NEGATIVE
Glucose, UA: NEGATIVE
KETONES UA: NEGATIVE
LEUKOCYTES UA: NEGATIVE
NITRITE UA: NEGATIVE
PH UA: 6.5
PROTEIN UA: NEGATIVE
Spec Grav, UA: 1.01
UROBILINOGEN UA: 0.2

## 2015-11-21 NOTE — Progress Notes (Signed)
NEW OB HISTORY AND PHYSICAL  SUBJECTIVE:       Renee Reyes is a 30 y.o. G2P1001 female, Patient's last menstrual period was 08/22/2015 (exact date)., Estimated Date of Delivery: 05/28/16, [redacted]w[redacted]d, presents today for establishment of Prenatal Care. She has no unusual complaints and complains of fatigue      Gynecologic History Patient's last menstrual period was 08/22/2015 (exact date). Normal Contraception: none Last Pap: 2016. Results were: normal  Obstetric History OB History  Gravida Para Term Preterm AB Living  2 1 1     1   SAB TAB Ectopic Multiple Live Births          1    # Outcome Date GA Lbr Len/2nd Weight Sex Delivery Anes PTL Lv  2 Current           1 Term 05/21/14   6.3 oz (0.179 kg) M Vag-Spont  N LIV      Past Medical History:  Diagnosis Date  . Medical history non-contributory     Past Surgical History:  Procedure Laterality Date  . NO PAST SURGERIES      Current Outpatient Prescriptions on File Prior to Visit  Medication Sig Dispense Refill  . Prenatal Vit-Fe Fumarate-FA (PRENATAL MULTIVITAMIN) TABS tablet Take 1 tablet by mouth daily at 12 noon.     No current facility-administered medications on file prior to visit.     No Known Allergies  Social History   Social History  . Marital status: Married    Spouse name: N/A  . Number of children: N/A  . Years of education: N/A   Occupational History  . Not on file.   Social History Main Topics  . Smoking status: Never Smoker  . Smokeless tobacco: Never Used  . Alcohol use No  . Drug use: No  . Sexual activity: Yes   Other Topics Concern  . Not on file   Social History Narrative  . No narrative on file    Family History  Problem Relation Age of Onset  . Diabetes Father   . Hypertension Father   . Arthritis Maternal Grandmother     The following portions of the patient's history were reviewed and updated as appropriate: allergies, current medications, past OB history, past medical  history, past surgical history, past family history, past social history, and problem list.    OBJECTIVE: Initial Physical Exam (New OB)  GENERAL APPEARANCE: alert, well appearing, in no apparent distress, oriented to person, place and time HEAD: normocephalic, atraumatic MOUTH: mucous membranes moist, pharynx normal without lesions and dental hygiene good THYROID: not examined BREASTS: not examined LUNGS: not examined HEART: not examined ABDOMEN: soft, nontender, nondistended, no abnormal masses, no epigastric pain, fundus not palpable and FHT present EXTREMITIES: no redness or tenderness in the calves or thighs SKIN: normal coloration and turgor, no rashes LYMPH NODES: no adenopathy palpable NEUROLOGIC: alert, oriented, normal speech, no focal findings or movement disorder noted  PELVIC EXAM not indicated  ASSESSMENT: Normal pregnancy Uterine fibroids  PLAN: Prenatal care Desires genetic screening- will return next week for blood draw See orders

## 2015-11-21 NOTE — Progress Notes (Signed)
NOB physical- pt is doing well, wants to do [place] will come back 11/24/15

## 2015-11-24 ENCOUNTER — Other Ambulatory Visit: Payer: BLUE CROSS/BLUE SHIELD

## 2015-12-01 ENCOUNTER — Encounter: Payer: Self-pay | Admitting: Obstetrics and Gynecology

## 2015-12-02 ENCOUNTER — Other Ambulatory Visit: Payer: Self-pay | Admitting: Obstetrics and Gynecology

## 2015-12-19 ENCOUNTER — Ambulatory Visit (INDEPENDENT_AMBULATORY_CARE_PROVIDER_SITE_OTHER): Payer: BLUE CROSS/BLUE SHIELD | Admitting: Obstetrics and Gynecology

## 2015-12-19 VITALS — BP 113/80 | HR 77 | Wt 135.3 lb

## 2015-12-19 DIAGNOSIS — Z3492 Encounter for supervision of normal pregnancy, unspecified, second trimester: Secondary | ICD-10-CM

## 2015-12-19 NOTE — Progress Notes (Signed)
ROB

## 2015-12-19 NOTE — Progress Notes (Signed)
ROB- pt is doing well, denies any complaints 

## 2015-12-19 NOTE — Progress Notes (Signed)
ROB- doing well, no concerns. Anatomy scan next visit,

## 2015-12-26 ENCOUNTER — Other Ambulatory Visit: Payer: Self-pay | Admitting: Obstetrics and Gynecology

## 2015-12-26 DIAGNOSIS — Z3492 Encounter for supervision of normal pregnancy, unspecified, second trimester: Secondary | ICD-10-CM

## 2016-01-02 ENCOUNTER — Ambulatory Visit (INDEPENDENT_AMBULATORY_CARE_PROVIDER_SITE_OTHER): Payer: BLUE CROSS/BLUE SHIELD

## 2016-01-02 DIAGNOSIS — Z3492 Encounter for supervision of normal pregnancy, unspecified, second trimester: Secondary | ICD-10-CM

## 2016-01-23 ENCOUNTER — Ambulatory Visit (INDEPENDENT_AMBULATORY_CARE_PROVIDER_SITE_OTHER): Payer: BLUE CROSS/BLUE SHIELD | Admitting: Obstetrics and Gynecology

## 2016-01-23 VITALS — BP 119/68 | HR 82 | Wt 140.8 lb

## 2016-01-23 DIAGNOSIS — R12 Heartburn: Secondary | ICD-10-CM

## 2016-01-23 DIAGNOSIS — Z3492 Encounter for supervision of normal pregnancy, unspecified, second trimester: Secondary | ICD-10-CM

## 2016-01-23 DIAGNOSIS — O26892 Other specified pregnancy related conditions, second trimester: Secondary | ICD-10-CM

## 2016-01-23 LAB — POCT URINALYSIS DIPSTICK
BILIRUBIN UA: NEGATIVE
Blood, UA: NEGATIVE
GLUCOSE UA: NEGATIVE
KETONES UA: NEGATIVE
LEUKOCYTES UA: NEGATIVE
Nitrite, UA: NEGATIVE
PH UA: 6
Protein, UA: NEGATIVE
Spec Grav, UA: 1.015
Urobilinogen, UA: 0.2

## 2016-01-23 MED ORDER — RANITIDINE HCL 150 MG PO TABS: 150.0000 mg | ORAL_TABLET | Freq: Two times a day (BID) | ORAL | 3 refills | Status: DC

## 2016-01-23 NOTE — Progress Notes (Signed)
ROB- Reports doing well but is having to use Zantac intermittently for heartburn. Discussed using it more regularly to prevent and patient agreed. States she would like prescription because of the cost OTC. Denies any other complaints. Will follow up in 4 weeks or sooner if needed.

## 2016-01-23 NOTE — Patient Instructions (Signed)
Second Trimester of Pregnancy The second trimester is from week 13 through week 28 (months 4 through 6). The second trimester is often a time when you feel your best. Your body has also adjusted to being pregnant, and you begin to feel better physically. Usually, morning sickness has lessened or quit completely, you may have more energy, and you may have an increase in appetite. The second trimester is also a time when the fetus is growing rapidly. At the end of the sixth month, the fetus is about 9 inches long and weighs about 1 pounds. You will likely begin to feel the baby move (quickening) between 18 and 20 weeks of the pregnancy. Body changes during your second trimester Your body continues to go through many changes during your second trimester. The changes vary from woman to woman.  Your weight will continue to increase. You will notice your lower abdomen bulging out.  You may begin to get stretch marks on your hips, abdomen, and breasts.  You may develop headaches that can be relieved by medicines. The medicines should be approved by your health care provider.  You may urinate more often because the fetus is pressing on your bladder.  You may develop or continue to have heartburn as a result of your pregnancy.  You may develop constipation because certain hormones are causing the muscles that push waste through your intestines to slow down.  You may develop hemorrhoids or swollen, bulging veins (varicose veins).  You may have back pain. This is caused by:  Weight gain.  Pregnancy hormones that are relaxing the joints in your pelvis.  A shift in weight and the muscles that support your balance.  Your breasts will continue to grow and they will continue to become tender.  Your gums may bleed and may be sensitive to brushing and flossing.  Dark spots or blotches (chloasma, mask of pregnancy) may develop on your face. This will likely fade after the baby is born.  A dark line  from your belly button to the pubic area (linea nigra) may appear. This will likely fade after the baby is born.  You may have changes in your hair. These can include thickening of your hair, rapid growth, and changes in texture. Some women also have hair loss during or after pregnancy, or hair that feels dry or thin. Your hair will most likely return to normal after your baby is born. What to expect at prenatal visits During a routine prenatal visit:  You will be weighed to make sure you and the fetus are growing normally.  Your blood pressure will be taken.  Your abdomen will be measured to track your baby's growth.  The fetal heartbeat will be listened to.  Any test results from the previous visit will be discussed. Your health care provider may ask you:  How you are feeling.  If you are feeling the baby move.  If you have had any abnormal symptoms, such as leaking fluid, bleeding, severe headaches, or abdominal cramping.  If you are using any tobacco products, including cigarettes, chewing tobacco, and electronic cigarettes.  If you have any questions. Other tests that may be performed during your second trimester include:  Blood tests that check for:  Low iron levels (anemia).  Gestational diabetes (between 24 and 28 weeks).  Rh antibodies. This is to check for a protein on red blood cells (Rh factor).  Urine tests to check for infections, diabetes, or protein in the urine.  An ultrasound to  confirm the proper growth and development of the baby.  An amniocentesis to check for possible genetic problems.  Fetal screens for spina bifida and Down syndrome.  HIV (human immunodeficiency virus) testing. Routine prenatal testing includes screening for HIV, unless you choose not to have this test. Follow these instructions at home: Eating and drinking  Continue to eat regular, healthy meals.  Avoid raw meat, uncooked cheese, cat litter boxes, and soil used by cats. These  carry germs that can cause birth defects in the baby.  Take your prenatal vitamins.  Take 1500-2000 mg of calcium daily starting at the 20th week of pregnancy until you deliver your baby.  If you develop constipation:  Take over-the-counter or prescription medicines.  Drink enough fluid to keep your urine clear or pale yellow.  Eat foods that are high in fiber, such as fresh fruits and vegetables, whole grains, and beans.  Limit foods that are high in fat and processed sugars, such as fried and sweet foods. Activity  Exercise only as directed by your health care provider. Experiencing uterine cramps is a good sign to stop exercising.  Avoid heavy lifting, wear low heel shoes, and practice good posture.  Wear your seat belt at all times when driving.  Rest with your legs elevated if you have leg cramps or low back pain.  Wear a good support bra for breast tenderness.  Do not use hot tubs, steam rooms, or saunas. Lifestyle  Avoid all smoking, herbs, alcohol, and unprescribed drugs. These chemicals affect the formation and growth of the baby.  Do not use any products that contain nicotine or tobacco, such as cigarettes and e-cigarettes. If you need help quitting, ask your health care provider.  A sexual relationship may be continued unless your health care provider directs you otherwise. General instructions  Follow your health care provider's instructions regarding medicine use. There are medicines that are either safe or unsafe to take during pregnancy.  Take warm sitz baths to soothe any pain or discomfort caused by hemorrhoids. Use hemorrhoid cream if your health care provider approves.  If you develop varicose veins, wear support hose. Elevate your feet for 15 minutes, 3-4 times a day. Limit salt in your diet.  Visit your dentist if you have not gone yet during your pregnancy. Use a soft toothbrush to brush your teeth and be gentle when you floss.  Keep all follow-up  prenatal visits as told by your health care provider. This is important. Contact a health care provider if:  You have dizziness.  You have mild pelvic cramps, pelvic pressure, or nagging pain in the abdominal area.  You have persistent nausea, vomiting, or diarrhea.  You have a bad smelling vaginal discharge.  You have pain with urination. Get help right away if:  You have a fever.  You are leaking fluid from your vagina.  You have spotting or bleeding from your vagina.  You have severe abdominal cramping or pain.  You have rapid weight gain or weight loss.  You have shortness of breath with chest pain.  You notice sudden or extreme swelling of your face, hands, ankles, feet, or legs.  You have not felt your baby move in over an hour.  You have severe headaches that do not go away with medicine.  You have vision changes. Summary  The second trimester is from week 13 through week 28 (months 4 through 6). It is also a time when the fetus is growing rapidly.  Your body goes  through many changes during pregnancy. The changes vary from woman to woman.  Avoid all smoking, herbs, alcohol, and unprescribed drugs. These chemicals affect the formation and growth your baby.  Do not use any tobacco products, such as cigarettes, chewing tobacco, and e-cigarettes. If you need help quitting, ask your health care provider.  Contact your health care provider if you have any questions. Keep all prenatal visits as told by your health care provider. This is important. This information is not intended to replace advice given to you by your health care provider. Make sure you discuss any questions you have with your health care provider. Document Released: 02/16/2001 Document Revised: 07/31/2015 Document Reviewed: 04/25/2012 Elsevier Interactive Patient Education  2017 [employer].

## 2016-01-23 NOTE — Progress Notes (Signed)
ROB- pt is doing well, states she is having some heartburn using zantac

## 2016-02-06 ENCOUNTER — Telehealth: Payer: Self-pay

## 2016-02-06 NOTE — Telephone Encounter (Signed)
Pt states her son has hand, foot and mouth disease. Per pt he is in the early stages. Advised pt to have good hand hygiene. Tylenol if fever. Virus will have to run its course. Pt voices understanding.

## 2016-02-20 ENCOUNTER — Ambulatory Visit (INDEPENDENT_AMBULATORY_CARE_PROVIDER_SITE_OTHER): Payer: BLUE CROSS/BLUE SHIELD | Admitting: Obstetrics and Gynecology

## 2016-02-20 VITALS — BP 128/77 | HR 96 | Wt 148.1 lb

## 2016-02-20 DIAGNOSIS — Z3492 Encounter for supervision of normal pregnancy, unspecified, second trimester: Secondary | ICD-10-CM

## 2016-02-20 NOTE — Progress Notes (Signed)
+  ROB- pt states last night she episode of dizziness and lightheaded, otherwise she is doing well

## 2016-02-20 NOTE — Progress Notes (Signed)
ROB-doing well, glucola next visit.

## 2016-03-08 NOTE — L&D Delivery Note (Signed)
Delivery Note   Renee Reyes is a 31 y.o. G2P1001 at [redacted]w[redacted]d Estimated Date of Delivery: 05/28/16  PRE-OPERATIVE DIAGNOSIS:  1) [redacted]w[redacted]d pregnancy.   POST-OPERATIVE DIAGNOSIS:  1) [redacted]w[redacted]d pregnancy s/p Vaginal, Spontaneous Delivery   Delivery Type: Vaginal, Spontaneous Delivery    Delivery Clinician: Philip Aspen   Delivery Anesthesia: Epidural   Labor Complications:   Meconium stained fluid  Additional complications: none  ESTIMATED BLOOD LOSS: 350  ml    FINDINGS:   1) female infant, Apgar scores of 8    at 1 minute and 9    at 5 minutes and a birthweight of    ounces.    2) Nuchal cord: NO  SPECIMENS:   PLACENTA:   Appearance: Intact    Removal: Spontaneous      Disposition:   Normal cord insertion with 3   vessels.  DISPOSITION:  Infant to left in stable condition in the delivery room, with L&D personnel and mother,  NARRATIVE SUMMARY: Labor course:  Renee Reyes is a G2P1001 at [redacted]w[redacted]d who presented for labor management.  She progressed well in labor without pitocin.  She received the appropriate anesthesia and proceeded to complete dilation. She evidenced good maternal expulsive effort during the second stage. She went on to deliver a viable female infant. The placenta delivered without problems and was noted to be complete with 3 vessels A perineal and vaginal examination was performed. Episiotomy/Lacerations: 2nd degree  Episiotomy or lacerations were repaired with 3-0 Vicryl repeat suture using local anesthesia. The patient tolerated this well.  Philip Aspen, CNM 05/18/2016 5:20 AM   I have reviewed the record and concur with patient management and plan. I was present for the delivery and 2nd degree laceration repair.  Theron Cumbie, Hassell Done, MD, Cherlynn June

## 2016-03-09 ENCOUNTER — Ambulatory Visit (INDEPENDENT_AMBULATORY_CARE_PROVIDER_SITE_OTHER): Payer: BLUE CROSS/BLUE SHIELD | Admitting: Obstetrics and Gynecology

## 2016-03-09 ENCOUNTER — Other Ambulatory Visit: Payer: BLUE CROSS/BLUE SHIELD

## 2016-03-09 VITALS — BP 128/82 | HR 83 | Wt 151.8 lb

## 2016-03-09 DIAGNOSIS — Z3493 Encounter for supervision of normal pregnancy, unspecified, third trimester: Secondary | ICD-10-CM

## 2016-03-09 DIAGNOSIS — Z131 Encounter for screening for diabetes mellitus: Secondary | ICD-10-CM

## 2016-03-09 DIAGNOSIS — Z23 Encounter for immunization: Secondary | ICD-10-CM

## 2016-03-09 LAB — POCT URINALYSIS DIPSTICK
Bilirubin, UA: NEGATIVE
GLUCOSE UA: 250
Ketones, UA: 15
Leukocytes, UA: NEGATIVE
NITRITE UA: NEGATIVE
PH UA: 6
RBC UA: NEGATIVE
Spec Grav, UA: 1.015
UROBILINOGEN UA: 0.2

## 2016-03-09 MED ORDER — TETANUS-DIPHTH-ACELL PERTUSSIS 5-2.5-18.5 LF-MCG/0.5 IM SUSP
0.5000 mL | Freq: Once | INTRAMUSCULAR | Status: AC
  Administered 2016-03-09: 0.5 mL via INTRAMUSCULAR

## 2016-03-09 NOTE — Progress Notes (Signed)
ROB- glucola done, blood consent signed,tdap given Pt is doing well

## 2016-03-09 NOTE — Progress Notes (Signed)
ROB- doing well, Tdap given,

## 2016-03-09 NOTE — Patient Instructions (Signed)
Third Trimester of Pregnancy The third trimester is from week 29 through week 40 (months 7 through 9). The third trimester is a time when the unborn baby (fetus) is growing rapidly. At the end of the ninth month, the fetus is about 20 inches in length and weighs 6-10 pounds. Body changes during your third trimester Your body goes through many changes during pregnancy. The changes vary from woman to woman. During the third trimester:  Your weight will continue to increase. You can expect to gain 25-35 pounds (11-16 kg) by the end of the pregnancy.  You may begin to get stretch marks on your hips, abdomen, and breasts.  You may urinate more often because the fetus is moving lower into your pelvis and pressing on your bladder.  You may develop or continue to have heartburn. This is caused by increased hormones that slow down muscles in the digestive tract.  You may develop or continue to have constipation because increased hormones slow digestion and cause the muscles that push waste through your intestines to relax.  You may develop hemorrhoids. These are swollen veins (varicose veins) in the rectum that can itch or be painful.  You may develop swollen, bulging veins (varicose veins) in your legs.  You may have increased body aches in the pelvis, back, or thighs. This is due to weight gain and increased hormones that are relaxing your joints.  You may have changes in your hair. These can include thickening of your hair, rapid growth, and changes in texture. Some women also have hair loss during or after pregnancy, or hair that feels dry or thin. Your hair will most likely return to normal after your baby is born.  Your breasts will continue to grow and they will continue to become tender. A yellow fluid (colostrum) may leak from your breasts. This is the first milk you are producing for your baby.  Your belly button may stick out.  You may notice more swelling in your hands, face, or  ankles.  You may have increased tingling or numbness in your hands, arms, and legs. The skin on your belly may also feel numb.  You may feel short of breath because of your expanding uterus.  You may have more problems sleeping. This can be caused by the size of your belly, increased need to urinate, and an increase in your body's metabolism.  You may notice the fetus "dropping," or moving lower in your abdomen.  You may have increased vaginal discharge.  Your cervix becomes thin and soft (effaced) near your due date. What to expect at prenatal visits You will have prenatal exams every 2 weeks until week 36. Then you will have weekly prenatal exams. During a routine prenatal visit:  You will be weighed to make sure you and the fetus are growing normally.  Your blood pressure will be taken.  Your abdomen will be measured to track your baby's growth.  The fetal heartbeat will be listened to.  Any test results from the previous visit will be discussed.  You may have a cervical check near your due date to see if you have effaced. At around 36 weeks, your health care provider will check your cervix. At the same time, your health care provider will also perform a test on the secretions of the vaginal tissue. This test is to determine if a type of bacteria, Group B streptococcus, is present. Your health care provider will explain this further. Your health care provider may ask you:    What your birth plan is.  How you are feeling.  If you are feeling the baby move.  If you have had any abnormal symptoms, such as leaking fluid, bleeding, severe headaches, or abdominal cramping.  If you are using any tobacco products, including cigarettes, chewing tobacco, and electronic cigarettes.  If you have any questions. Other tests or screenings that may be performed during your third trimester include:  Blood tests that check for low iron levels (anemia).  Fetal testing to check the health,  activity level, and growth of the fetus. Testing is done if you have certain medical conditions or if there are problems during the pregnancy.  Nonstress test (NST). This test checks the health of your baby to make sure there are no signs of problems, such as the baby not getting enough oxygen. During this test, a belt is placed around your belly. The baby is made to move, and its heart rate is monitored during movement. What is false labor? False labor is a condition in which you feel small, irregular tightenings of the muscles in the womb (contractions) that eventually go away. These are called Braxton Hicks contractions. Contractions may last for hours, days, or even weeks before true labor sets in. If contractions come at regular intervals, become more frequent, increase in intensity, or become painful, you should see your health care provider. What are the signs of labor?  Abdominal cramps.  Regular contractions that start at 10 minutes apart and become stronger and more frequent with time.  Contractions that start on the top of the uterus and spread down to the lower abdomen and back.  Increased pelvic pressure and dull back pain.  A watery or bloody mucus discharge that comes from the vagina.  Leaking of amniotic fluid. This is also known as your "water breaking." It could be a slow trickle or a gush. Let your doctor know if it has a color or strange odor. If you have any of these signs, call your health care provider right away, even if it is before your due date. Follow these instructions at home: Eating and drinking  Continue to eat regular, healthy meals.  Do not eat:  Raw meat or meat spreads.  Unpasteurized milk or cheese.  Unpasteurized juice.  Store-made salad.  Refrigerated smoked seafood.  Hot dogs or deli meat, unless they are piping hot.  More than 6 ounces of albacore tuna a week.  Shark, swordfish, king mackerel, or tile fish.  Store-made salads.  Raw  sprouts, such as mung bean or alfalfa sprouts.  Take prenatal vitamins as told by your health care provider.  Take 1000 mg of calcium daily as told by your health care provider.  If you develop constipation:  Take over-the-counter or prescription medicines.  Drink enough fluid to keep your urine clear or pale yellow.  Eat foods that are high in fiber, such as fresh fruits and vegetables, whole grains, and beans.  Limit foods that are high in fat and processed sugars, such as fried and sweet foods. Activity  Exercise only as directed by your health care provider. Healthy pregnant women should aim for 2 hours and 30 minutes of moderate exercise per week. If you experience any pain or discomfort while exercising, stop.  Avoid heavy lifting.  Do not exercise in extreme heat or humidity, or at high altitudes.  Wear low-heel, comfortable shoes.  Practice good posture.  Do not travel far distances unless it is absolutely necessary and only with the approval   of your health care provider.  Wear your seat belt at all times while in a car, on a bus, or on a plane.  Take frequent breaks and rest with your legs elevated if you have leg cramps or low back pain.  Do not use hot tubs, steam rooms, or saunas.  You may continue to have sex unless your health care provider tells you otherwise. Lifestyle  Do not use any products that contain nicotine or tobacco, such as cigarettes and e-cigarettes. If you need help quitting, ask your health care provider.  Do not drink alcohol.  Do not use any medicinal herbs or unprescribed drugs. These chemicals affect the formation and growth of the baby.  If you develop varicose veins:  Wear support pantyhose or compression stockings as told by your healthcare provider.  Elevate your feet for 15 minutes, 3-4 times a day.  Wear a supportive maternity bra to help with breast tenderness. General instructions  Take over-the-counter and prescription  medicines only as told by your health care provider. There are medicines that are either safe or unsafe to take during pregnancy.  Take warm sitz baths to soothe any pain or discomfort caused by hemorrhoids. Use hemorrhoid cream or witch hazel if your health care provider approves.  Avoid cat litter boxes and soil used by cats. These carry germs that can cause birth defects in the baby. If you have a cat, ask someone to clean the litter box for you.  To prepare for the arrival of your baby:  Take prenatal classes to understand, practice, and ask questions about the labor and delivery.  Make a trial run to the hospital.  Visit the hospital and tour the maternity area.  Arrange for maternity or paternity leave through employers.  Arrange for family and friends to take care of pets while you are in the hospital.  Purchase a rear-facing car seat and make sure you know how to install it in your car.  Pack your hospital bag.  Prepare the baby's nursery. Make sure to remove all pillows and stuffed animals from the baby's crib to prevent suffocation.  Visit your dentist if you have not gone during your pregnancy. Use a soft toothbrush to brush your teeth and be gentle when you floss.  Keep all prenatal follow-up visits as told by your health care provider. This is important. Contact a health care provider if:  You are unsure if you are in labor or if your water has broken.  You become dizzy.  You have mild pelvic cramps, pelvic pressure, or nagging pain in your abdominal area.  You have lower back pain.  You have persistent nausea, vomiting, or diarrhea.  You have an unusual or bad smelling vaginal discharge.  You have pain when you urinate. Get help right away if:  You have a fever.  You are leaking fluid from your vagina.  You have spotting or bleeding from your vagina.  You have severe abdominal pain or cramping.  You have rapid weight loss or weight gain.  You have  shortness of breath with chest pain.  You notice sudden or extreme swelling of your face, hands, ankles, feet, or legs.  Your baby makes fewer than 10 movements in 2 hours.  You have severe headaches that do not go away with medicine.  You have vision changes. Summary  The third trimester is from week 29 through week 40, months 7 through 9. The third trimester is a time when the unborn baby (fetus)   is growing rapidly.  During the third trimester, your discomfort may increase as you and your baby continue to gain weight. You may have abdominal, leg, and back pain, sleeping problems, and an increased need to urinate.  During the third trimester your breasts will keep growing and they will continue to become tender. A yellow fluid (colostrum) may leak from your breasts. This is the first milk you are producing for your baby.  False labor is a condition in which you feel small, irregular tightenings of the muscles in the womb (contractions) that eventually go away. These are called Braxton Hicks contractions. Contractions may last for hours, days, or even weeks before true labor sets in.  Signs of labor can include: abdominal cramps; regular contractions that start at 10 minutes apart and become stronger and more frequent with time; watery or bloody mucus discharge that comes from the vagina; increased pelvic pressure and dull back pain; and leaking of amniotic fluid. This information is not intended to replace advice given to you by your health care provider. Make sure you discuss any questions you have with your health care provider. Document Released: 02/16/2001 Document Revised: 07/31/2015 Document Reviewed: 04/25/2012 Elsevier Interactive Patient Education  2017 [employer]  

## 2016-03-10 LAB — HEMOGLOBIN AND HEMATOCRIT, BLOOD
HEMATOCRIT: 38 % (ref 34.0–46.6)
Hemoglobin: 13 g/dL (ref 11.1–15.9)

## 2016-03-10 LAB — GLUCOSE, 1 HOUR GESTATIONAL: Gestational Diabetes Screen: 126 mg/dL (ref 65–139)

## 2016-03-15 ENCOUNTER — Other Ambulatory Visit: Payer: Self-pay | Admitting: *Deleted

## 2016-03-15 ENCOUNTER — Telehealth: Payer: Self-pay | Admitting: Obstetrics and Gynecology

## 2016-03-15 MED ORDER — PROMETHAZINE HCL 25 MG RE SUPP: 25.0000 mg | Freq: Four times a day (QID) | RECTAL | 1 refills | Status: DC | PRN

## 2016-03-15 NOTE — Telephone Encounter (Signed)
Pt 29 wks and thinks she may have the flu// vomitting, diarrhea, stomach pain

## 2016-03-15 NOTE — Telephone Encounter (Signed)
Called pt advised her to try to stay hydrated as possible, ginger ale, pop sicles, called her in some phenegran supp, advised her to rest as much as possible. She voiced understanding

## 2016-03-22 ENCOUNTER — Ambulatory Visit (INDEPENDENT_AMBULATORY_CARE_PROVIDER_SITE_OTHER): Payer: BLUE CROSS/BLUE SHIELD | Admitting: Certified Nurse Midwife

## 2016-03-22 VITALS — BP 125/72 | HR 81 | Wt 150.0 lb

## 2016-03-22 DIAGNOSIS — Z3493 Encounter for supervision of normal pregnancy, unspecified, third trimester: Secondary | ICD-10-CM

## 2016-03-22 LAB — POCT URINALYSIS DIPSTICK
Glucose, UA: NEGATIVE
PH UA: 5
PROTEIN UA: NEGATIVE
Spec Grav, UA: 1.01

## 2016-03-22 NOTE — Patient Instructions (Signed)
Introduction Patient Name: ________________________________________________ Patient Due Date: ____________________ What is a fetal movement count? A fetal movement count is the number of times that you feel your baby move during a certain amount of time. This may also be called a fetal kick count. A fetal movement count is recommended for every pregnant woman. You may be asked to start counting fetal movements as early as week 28 of your pregnancy. Pay attention to when your baby is most active. You may notice your baby's sleep and wake cycles. You may also notice things that make your baby move more. You should do a fetal movement count:  When your baby is normally most active.  At the same time each day. A good time to count movements is while you are resting, after having something to eat and drink. How do I count fetal movements? 1. Find a quiet, comfortable area. Sit, or lie down on your side. 2. Write down the date, the start time and stop time, and the number of movements that you felt between those two times. Take this information with you to your health care visits. 3. For 2 hours, count kicks, flutters, swishes, rolls, and jabs. You should feel at least 10 movements during 2 hours. 4. You may stop counting after you have felt 10 movements. 5. If you do not feel 10 movements in 2 hours, have something to eat and drink. Then, keep resting and counting for 1 hour. If you feel at least 4 movements during that hour, you may stop counting. Contact a health care provider if:  You feel fewer than 4 movements in 2 hours.  Your baby is not moving like he or she usually does. Date: ____________ Start time: ____________ Stop time: ____________ Movements: ____________ Date: ____________ Start time: ____________ Stop time: ____________ Movements: ____________ Date: ____________ Start time: ____________ Stop time: ____________ Movements: ____________ Date: ____________ Start time: ____________  Stop time: ____________ Movements: ____________ Date: ____________ Start time: ____________ Stop time: ____________ Movements: ____________ Date: ____________ Start time: ____________ Stop time: ____________ Movements: ____________ Date: ____________ Start time: ____________ Stop time: ____________ Movements: ____________ Date: ____________ Start time: ____________ Stop time: ____________ Movements: ____________ Date: ____________ Start time: ____________ Stop time: ____________ Movements: ____________ This information is not intended to replace advice given to you by your health care provider. Make sure you discuss any questions you have with your health care provider. Document Released: 03/24/2006 Document Revised: 10/22/2015 Document Reviewed: 04/03/2015 Elsevier Interactive Patient Education  2017 [employer]  

## 2016-03-22 NOTE — Progress Notes (Signed)
ROB-Pt doing well. No questions or concerns. Discussed red flag symptoms and when to call. RTC x 2 wks. Pt and her spouse [employer].

## 2016-03-22 NOTE — Progress Notes (Signed)
Pt is here for a routine prenatal visit. No c/o

## 2016-04-05 ENCOUNTER — Encounter: Payer: Self-pay | Admitting: Certified Nurse Midwife

## 2016-04-05 ENCOUNTER — Ambulatory Visit (INDEPENDENT_AMBULATORY_CARE_PROVIDER_SITE_OTHER): Payer: BLUE CROSS/BLUE SHIELD | Admitting: Certified Nurse Midwife

## 2016-04-05 VITALS — BP 107/64 | HR 92 | Wt 152.9 lb

## 2016-04-05 DIAGNOSIS — R82998 Other abnormal findings in urine: Secondary | ICD-10-CM

## 2016-04-05 DIAGNOSIS — Z3483 Encounter for supervision of other normal pregnancy, third trimester: Secondary | ICD-10-CM

## 2016-04-05 DIAGNOSIS — R8299 Other abnormal findings in urine: Secondary | ICD-10-CM

## 2016-04-05 LAB — POCT URINALYSIS DIPSTICK
BILIRUBIN UA: NEGATIVE
Bilirubin, UA: NEGATIVE
Blood, UA: NEGATIVE
Glucose, UA: NEGATIVE
Glucose, UA: NEGATIVE
KETONES UA: NEGATIVE
KETONES UA: NEGATIVE
Nitrite, UA: NEGATIVE
Nitrite, UA: NEGATIVE
PH UA: 7.5
PH UA: 7.5
RBC UA: NEGATIVE
SPEC GRAV UA: 1.01
Spec Grav, UA: 1.01
UROBILINOGEN UA: NEGATIVE
Urobilinogen, UA: NEGATIVE

## 2016-04-05 NOTE — Progress Notes (Signed)
ROB- no complaints.  Mod leuks- culture sent.

## 2016-04-05 NOTE — Progress Notes (Signed)
ROB-Pt doing well. No questions or concerns. Discussed leukorrhea of pregnancy. RTC x 2 weeks.

## 2016-04-07 LAB — URINE CULTURE: Organism ID, Bacteria: NO GROWTH

## 2016-04-19 ENCOUNTER — Ambulatory Visit (INDEPENDENT_AMBULATORY_CARE_PROVIDER_SITE_OTHER): Payer: BLUE CROSS/BLUE SHIELD | Admitting: Certified Nurse Midwife

## 2016-04-19 VITALS — BP 123/73 | HR 93 | Wt 153.3 lb

## 2016-04-19 DIAGNOSIS — Z3483 Encounter for supervision of other normal pregnancy, third trimester: Secondary | ICD-10-CM

## 2016-04-19 LAB — POCT URINALYSIS DIPSTICK
BILIRUBIN UA: NEGATIVE
Blood, UA: NEGATIVE
GLUCOSE UA: NEGATIVE
Ketones, UA: NEGATIVE
Leukocytes, UA: NEGATIVE
NITRITE UA: NEGATIVE
Protein, UA: NEGATIVE
Spec Grav, UA: 1.01
UROBILINOGEN UA: NEGATIVE
pH, UA: 6.5

## 2016-04-19 NOTE — Patient Instructions (Signed)
Round Ligament Pain Introduction The round ligament is a cord of muscle and tissue that helps to support the uterus. It can become a source of pain during pregnancy if it becomes stretched or twisted as the baby grows. The pain usually begins in the second trimester of pregnancy, and it can come and go until the baby is delivered. It is not a serious problem, and it does not cause harm to the baby. Round ligament pain is usually a short, sharp, and pinching pain, but it can also be a dull, lingering, and aching pain. The pain is felt in the lower side of the abdomen or in the groin. It usually starts deep in the groin and moves up to the outside of the hip area. Pain can occur with:  A sudden change in position.  Rolling over in bed.  Coughing or sneezing.  Physical activity. Follow these instructions at home: Watch your condition for any changes. Take these steps to help with your pain:  When the pain starts, relax. Then try:  Sitting down.  Flexing your knees up to your abdomen.  Lying on your side with one pillow under your abdomen and another pillow between your legs.  Sitting in a warm bath for 15-20 minutes or until the pain goes away.  Take over-the-counter and prescription medicines only as told by your health care provider.  Move slowly when you sit and stand.  Avoid long walks if they cause pain.  Stop or lessen your physical activities if they cause pain. Contact a health care provider if:  Your pain does not go away with treatment.  You feel pain in your back that you did not have before.  Your medicine is not helping. Get help right away if:  You develop a fever or chills.  You develop uterine contractions.  You develop vaginal bleeding.  You develop nausea or vomiting.  You develop diarrhea.  You have pain when you urinate. This information is not intended to replace advice given to you by your health care provider. Make sure you discuss any questions  you have with your health care provider. Document Released: 12/02/2007 Document Revised: 07/31/2015 Document Reviewed: 05/01/2014  2017 Elsevier  

## 2016-04-19 NOTE — Progress Notes (Signed)
ROB-Pt doing well. Reports round ligament pain, managing with abdominal support, stretching, and OTC medications. Discussed use of prenatal massage, requests letter for insurance. Will send via [facility]. RTC x 2 weeks for ROB.

## 2016-04-21 DIAGNOSIS — Z0289 Encounter for other administrative examinations: Secondary | ICD-10-CM

## 2016-05-03 ENCOUNTER — Ambulatory Visit (INDEPENDENT_AMBULATORY_CARE_PROVIDER_SITE_OTHER): Payer: BLUE CROSS/BLUE SHIELD | Admitting: Certified Nurse Midwife

## 2016-05-03 VITALS — BP 116/83 | HR 92 | Wt 153.9 lb

## 2016-05-03 DIAGNOSIS — O2343 Unspecified infection of urinary tract in pregnancy, third trimester: Secondary | ICD-10-CM

## 2016-05-03 DIAGNOSIS — B373 Candidiasis of vulva and vagina: Secondary | ICD-10-CM

## 2016-05-03 DIAGNOSIS — B3731 Acute candidiasis of vulva and vagina: Secondary | ICD-10-CM

## 2016-05-03 DIAGNOSIS — Z3685 Encounter for antenatal screening for Streptococcus B: Secondary | ICD-10-CM

## 2016-05-03 DIAGNOSIS — Z113 Encounter for screening for infections with a predominantly sexual mode of transmission: Secondary | ICD-10-CM

## 2016-05-03 DIAGNOSIS — Z3483 Encounter for supervision of other normal pregnancy, third trimester: Secondary | ICD-10-CM

## 2016-05-03 LAB — POCT URINALYSIS DIPSTICK
Bilirubin, UA: NEGATIVE
Glucose, UA: NEGATIVE
Ketones, UA: NEGATIVE
Nitrite, UA: NEGATIVE
PROTEIN UA: NEGATIVE
Spec Grav, UA: 1.015
UROBILINOGEN UA: NEGATIVE
pH, UA: 6.5

## 2016-05-03 MED ORDER — FLUCONAZOLE 150 MG PO TABS: 150.0000 mg | ORAL_TABLET | Freq: Once | ORAL | 0 refills | Status: AC

## 2016-05-03 NOTE — Addendum Note (Signed)
Addended by: Donalee Citrin on: 05/03/2016 09:47 AM   Modules accepted: Orders

## 2016-05-03 NOTE — Progress Notes (Signed)
ROB-Pt doing well. Completing antibiotics for UTI and reports vaginal itching. Rx Diflucan. 36 week cultures collected. Reviewed red flag symptoms and when to call. RTC x 1 week.

## 2016-05-03 NOTE — Progress Notes (Signed)
Pt is for 36wk cultures today, notes that she was treated recently for UTI at urgent care, has 1 more day of antibiotics, believes she may have yeast infection.

## 2016-05-03 NOTE — Patient Instructions (Signed)
Braxton Hicks Contractions °Contractions of the uterus can occur throughout pregnancy. Contractions are not always a sign that you are in labor.  °WHAT ARE BRAXTON HICKS CONTRACTIONS?  °Contractions that occur before labor are called Braxton Hicks contractions, or false labor. Toward the end of pregnancy (32-34 weeks), these contractions can develop more often and may become more forceful. This is not true labor because these contractions do not result in opening (dilatation) and thinning of the cervix. They are sometimes difficult to tell apart from true labor because these contractions can be forceful and people have different pain tolerances. You should not feel embarrassed if you go to the hospital with false labor. Sometimes, the only way to tell if you are in true labor is for your health care provider to look for changes in the cervix. °If there are no prenatal problems or other health problems associated with the pregnancy, it is completely safe to be sent home with false labor and await the onset of true labor. °HOW CAN YOU TELL THE DIFFERENCE BETWEEN TRUE AND FALSE LABOR? °False Labor  °· The contractions of false labor are usually shorter and not as hard as those of true labor.   °· The contractions are usually irregular.   °· The contractions are often felt in the front of the lower abdomen and in the groin.   °· The contractions may go away when you walk around or change positions while lying down.   °· The contractions get weaker and are shorter lasting as time goes on.   °· The contractions do not usually become progressively stronger, regular, and closer together as with true labor.   °True Labor  °· Contractions in true labor last 30-70 seconds, become very regular, usually become more intense, and increase in frequency.   °· The contractions do not go away with walking.   °· The discomfort is usually felt in the top of the uterus and spreads to the lower abdomen and low back.   °· True labor can be  determined by your health care provider with an exam. This will show that the cervix is dilating and getting thinner.   °WHAT TO REMEMBER °· Keep up with your usual exercises and follow other instructions given by your health care provider.   °· Take medicines as directed by your health care provider.   °· Keep your regular prenatal appointments.   °· Eat and drink lightly if you think you are going into labor.   °· If Braxton Hicks contractions are making you uncomfortable:   °¨ Change your position from lying down or resting to walking, or from walking to resting.   °¨ Sit and rest in a tub of warm water.   °¨ Drink 2-3 glasses of water. Dehydration may cause these contractions.   °¨ Do slow and deep breathing several times an hour.   °WHEN SHOULD I SEEK IMMEDIATE MEDICAL CARE? °Seek immediate medical care if: °· Your contractions become stronger, more regular, and closer together.   °· You have fluid leaking or gushing from your vagina.   °· You have a fever.   °· You pass blood-tinged mucus.   °· You have vaginal bleeding.   °· You have continuous abdominal pain.   °· You have low back pain that you never had before.   °· You feel your baby's head pushing down and causing pelvic pressure.   °· Your baby is not moving as much as it used to.   °This information is not intended to replace advice given to you by your health care provider. Make sure you discuss any questions you have with your health care   provider. °Document Released: 02/22/2005 Document Revised: 06/16/2015 Document Reviewed: 12/04/2012 °Elsevier Interactive Patient Education © 2017 [employer] ° °

## 2016-05-04 LAB — GC/CHLAMYDIA PROBE AMP
Chlamydia trachomatis, NAA: NEGATIVE
NEISSERIA GONORRHOEAE BY PCR: NEGATIVE

## 2016-05-06 ENCOUNTER — Telehealth: Payer: Self-pay | Admitting: Obstetrics and Gynecology

## 2016-05-06 ENCOUNTER — Other Ambulatory Visit: Payer: Self-pay | Admitting: *Deleted

## 2016-05-06 MED ORDER — POLYETHYLENE GLYCOL 3350 17 GM/SCOOP PO POWD: 1.0000 | Freq: Once | ORAL | 0 refills | Status: AC

## 2016-05-06 NOTE — Telephone Encounter (Signed)
Called pt she hasnt had a BM in over a week, is still passing gas, we discussed the colace to help make stool soft and miralax was sent in

## 2016-05-06 NOTE — Telephone Encounter (Signed)
PT CALLED AND SHE HAS NOT HAD A BM FOR A WEEK, HAS TRIED EVERYTHING, COLICAE, JUICE AND NOTHING, SHE IS GETTING VERY UNCOMFORTABLE AND DOESN'T KNOW WHAT ELSE SHE CAN DO, PT WOULD LIKE A CALL BACK.

## 2016-05-07 LAB — CULTURE, BETA STREP (GROUP B ONLY): STREP GP B CULTURE: NEGATIVE

## 2016-05-10 ENCOUNTER — Encounter: Payer: Self-pay | Admitting: Certified Nurse Midwife

## 2016-05-10 ENCOUNTER — Ambulatory Visit (INDEPENDENT_AMBULATORY_CARE_PROVIDER_SITE_OTHER): Payer: BLUE CROSS/BLUE SHIELD | Admitting: Certified Nurse Midwife

## 2016-05-10 VITALS — BP 113/65 | HR 89 | Wt 154.9 lb

## 2016-05-10 DIAGNOSIS — Z3483 Encounter for supervision of other normal pregnancy, third trimester: Secondary | ICD-10-CM

## 2016-05-10 LAB — POCT URINALYSIS DIPSTICK
Blood, UA: NEGATIVE
GLUCOSE UA: NEGATIVE
KETONES UA: NEGATIVE
Nitrite, UA: NEGATIVE
SPEC GRAV UA: 1.02
Urobilinogen, UA: NEGATIVE
pH, UA: 7

## 2016-05-10 NOTE — Progress Notes (Signed)
ROB- no complaints. Wants cervix check. GBS/GC.CH-neg.

## 2016-05-10 NOTE — Patient Instructions (Signed)

## 2016-05-10 NOTE — Progress Notes (Signed)
ROB-Pt doing well. Reviewed results of 36 week cultures. No questions or concerns. Son's birthday party is on 03/[birth year]. Reviewed red flag symptoms and when to call. RTC x 1 week.

## 2016-05-17 ENCOUNTER — Ambulatory Visit (INDEPENDENT_AMBULATORY_CARE_PROVIDER_SITE_OTHER): Payer: BLUE CROSS/BLUE SHIELD | Admitting: Certified Nurse Midwife

## 2016-05-17 VITALS — BP 128/83 | HR 76 | Wt 156.4 lb

## 2016-05-17 DIAGNOSIS — Z3493 Encounter for supervision of normal pregnancy, unspecified, third trimester: Secondary | ICD-10-CM

## 2016-05-17 LAB — POCT URINALYSIS DIPSTICK
BILIRUBIN UA: NEGATIVE
GLUCOSE UA: NEGATIVE
KETONES UA: NEGATIVE
NITRITE UA: NEGATIVE
PH UA: 7
RBC UA: NEGATIVE
SPEC GRAV UA: 1.01
Urobilinogen, UA: 0.2

## 2016-05-17 NOTE — Patient Instructions (Signed)
Vaginal Delivery Vaginal delivery means that you will give birth by pushing your baby out of your birth canal (vagina). A team of health care providers will help you before, during, and after vaginal delivery. Birth experiences are unique for every woman and every pregnancy, and birth experiences vary depending on where you choose to give birth. What should I do to prepare for my baby's birth? Before your baby is born, it is important to talk with your health care provider about:  Your labor and delivery preferences. These may include:  Medicines that you may be given.  How you will manage your pain. This might include non-medical pain relief techniques or injectable pain relief such as epidural analgesia.  How you and your baby will be monitored during labor and delivery.  Who may be in the labor and delivery room with you.  Your feelings about surgical delivery of your baby (cesarean delivery, or C-section) if this becomes necessary.  Your feelings about receiving donated blood through an IV tube (blood transfusion) if this becomes necessary.  Whether you are able:  To take pictures or videos of the birth.  To eat during labor and delivery.  To move around, walk, or change positions during labor and delivery.  What to expect after your baby is born, such as:  Whether delayed umbilical cord clamping and cutting is offered.  Who will care for your baby right after birth.  Medicines or tests that may be recommended for your baby.  Whether breastfeeding is supported in your hospital or birth center.  How long you will be in the hospital or birth center.  How any medical conditions you have may affect your baby or your labor and delivery experience. To prepare for your baby's birth, you should also:  Attend all of your health care visits before delivery (prenatal visits) as recommended by your health care provider. This is important.  Prepare your home for your baby's  arrival. Make sure that you have:  Diapers.  Baby clothing.  Feeding equipment.  Safe sleeping arrangements for you and your baby.  Install a car seat in your vehicle. Have your car seat checked by a certified car seat installer to make sure that it is installed safely.  Think about who will help you with your new baby at home for at least the first several weeks after delivery. What can I expect when I arrive at the birth center or hospital? Once you are in labor and have been admitted into the hospital or birth center, your health care provider may:  Review your pregnancy history and any concerns you have.  Insert an IV tube into one of your veins. This is used to give you fluids and medicines.  Check your blood pressure, pulse, temperature, and heart rate (vital signs).  Check whether your bag of water (amniotic sac) has broken (ruptured).  Talk with you about your birth plan and discuss pain control options. Monitoring  Your health care provider may monitor your contractions (uterine monitoring) and your baby's heart rate (fetal monitoring). You may need to be monitored:  Often, but not continuously (intermittently).  All the time or for long periods at a time (continuously). Continuous monitoring may be needed if:  You are taking certain medicines, such as medicine to relieve pain or make your contractions stronger.  You have pregnancy or labor complications. Monitoring may be done by:  Placing a special stethoscope or a handheld monitoring device on your abdomen to check your baby's   heartbeat, and feeling your abdomen for contractions. This method of monitoring does not continuously record your baby's heartbeat or your contractions.  Placing monitors on your abdomen (external monitors) to record your baby's heartbeat and the frequency and length of contractions. You may not have to wear external monitors all the time.  Placing monitors inside of your uterus (internal  monitors) to record your baby's heartbeat and the frequency, length, and strength of your contractions.  Your health care provider may use internal monitors if he or she needs more information about the strength of your contractions or your baby's heart rate.  Internal monitors are put in place by passing a thin, flexible wire through your vagina and into your uterus. Depending on the type of monitor, it may remain in your uterus or on your baby's head until birth.  Your health care provider will discuss the benefits and risks of internal monitoring with you and will ask for your permission before inserting the monitors.  Telemetry. This is a type of continuous monitoring that can be done with external or internal monitors. Instead of having to stay in bed, you are able to move around during telemetry. Ask your health care provider if telemetry is an option for you. Physical exam  Your health care provider may perform a physical exam. This may include:  Checking whether your baby is positioned:  With the head toward your vagina (head-down). This is most common.  With the head toward the top of your uterus (head-up or breech). If your baby is in a breech position, your health care provider may try to turn your baby to a head-down position so you can deliver vaginally. If it does not seem that your baby can be born vaginally, your provider may recommend surgery to deliver your baby. In rare cases, you may be able to deliver vaginally if your baby is head-up (breech delivery).  Lying sideways (transverse). Babies that are lying sideways cannot be delivered vaginally.  Checking your cervix to determine:  Whether it is thinning out (effacing).  Whether it is opening up (dilating).  How low your baby has moved into your birth canal. What are the three stages of labor and delivery?   Normal labor and delivery is divided into the following three stages: Stage 1   Stage 1 is the longest stage  of labor, and it can last for hours or days. Stage 1 includes:  Early labor. This is when contractions may be irregular, or regular and mild. Generally, early labor contractions are more than 10 minutes apart.  Active labor. This is when contractions get longer, more regular, more frequent, and more intense.  The transition phase. This is when contractions happen very close together, are very intense, and may last longer than during any other part of labor.  Contractions generally feel mild, infrequent, and irregular at first. They get stronger, more frequent (about every 2-3 minutes), and more regular as you progress from early labor through active labor and transition.  Many women progress through stage 1 naturally, but you may need help to continue making progress. If this happens, your health care provider may talk with you about:  Rupturing your amniotic sac if it has not ruptured yet.  Giving you medicine to help make your contractions stronger and more frequent.  Stage 1 ends when your cervix is completely dilated to 4 inches (10 cm) and completely effaced. This happens at the end of the transition phase. Stage 2   Once your   cervix is completely effaced and dilated to 4 inches (10 cm), you may start to feel an urge to push. It is common for the body to naturally take a rest before feeling the urge to push, especially if you received an epidural or certain other pain medicines. This rest period may last for up to 1-2 hours, depending on your unique labor experience.  During stage 2, contractions are generally less painful, because pushing helps relieve contraction pain. Instead of contraction pain, you may feel stretching and burning pain, especially when the widest part of your baby's head passes through the vaginal opening (crowning).  Your health care provider will closely monitor your pushing progress and your baby's progress through the vagina during stage 2.  Your health care  provider may massage the area of skin between your vaginal opening and anus (perineum) or apply warm compresses to your perineum. This helps it stretch as the baby's head starts to crown, which can help prevent perineal tearing.  In some cases, an incision may be made in your perineum (episiotomy) to allow the baby to pass through the vaginal opening. An episiotomy helps to make the opening of the vagina larger to allow more room for the baby to fit through.  It is very important to breathe and focus so your health care provider can control the delivery of your baby's head. Your health care provider may have you decrease the intensity of your pushing, to help prevent perineal tearing.  After delivery of your baby's head, the shoulders and the rest of the body generally deliver very quickly and without difficulty.  Once your baby is delivered, the umbilical cord may be cut right away, or this may be delayed for 1-2 minutes, depending on your baby's health. This may vary among health care providers, hospitals, and birth centers.  If you and your baby are healthy enough, your baby may be placed on your chest or abdomen to help maintain the baby's temperature and to help you bond with each other. Some mothers and babies start breastfeeding at this time. Your health care team will dry your baby and help keep your baby warm during this time.  Your baby may need immediate care if he or she:  Showed signs of distress during labor.  Has a medical condition.  Was born too early (prematurely).  Had a bowel movement before birth (meconium).  Shows signs of difficulty transitioning from being inside the uterus to being outside of the uterus. If you are planning to breastfeed, your health care team will help you begin a feeding. Stage 3   The third stage of labor starts immediately after the birth of your baby and ends after you deliver the placenta. The placenta is an organ that develops during  pregnancy to provide oxygen and nutrients to your baby in the womb.  Delivering the placenta may require some pushing, and you may have mild contractions. Breastfeeding can stimulate contractions to help you deliver the placenta.  After the placenta is delivered, your uterus should tighten (contract) and become firm. This helps to stop bleeding in your uterus. To help your uterus contract and to control bleeding, your health care provider may:  Give you medicine by injection, through an IV tube, by mouth, or through your rectum (rectally).  Massage your abdomen or perform a vaginal exam to remove any blood clots that are left in your uterus.  Empty your bladder by placing a thin, flexible tube (catheter) into your bladder.  Encourage   you to breastfeed your baby. After labor is over, you and your baby will be monitored closely to ensure that you are both healthy until you are ready to go home. Your health care team will teach you how to care for yourself and your baby. This information is not intended to replace advice given to you by your health care provider. Make sure you discuss any questions you have with your health care provider. Document Released: 12/02/2007 Document Revised: 09/12/2015 Document Reviewed: 03/09/2015 Elsevier Interactive Patient Education  2017 [place] of Pregnancy The third trimester is from week 29 through week 42, months 7 through 9. This trimester is when your unborn baby (fetus) is growing very fast. At the end of the ninth month, the unborn baby is about 20 inches in length. It weighs about 6-10 pounds. Follow these instructions at home:  Avoid all smoking, herbs, and alcohol. Avoid drugs not approved by your doctor.  Do not use any tobacco products, including cigarettes, chewing tobacco, and electronic cigarettes. If you need help quitting, ask your doctor. You may get counseling or other support to help you quit.  Only take medicine as  told by your doctor. Some medicines are safe and some are not during pregnancy.  Exercise only as told by your doctor. Stop exercising if you start having cramps.  Eat regular, healthy meals.  Wear a good support bra if your breasts are tender.  Do not use hot tubs, steam rooms, or saunas.  Wear your seat belt when driving.  Avoid raw meat, uncooked cheese, and liter boxes and soil used by cats.  Take your prenatal vitamins.  Take 1500-2000 milligrams of calcium daily starting at the 20th week of pregnancy until you deliver your baby.  Try taking medicine that helps you poop (stool softener) as needed, and if your doctor approves. Eat more fiber by eating fresh fruit, vegetables, and whole grains. Drink enough fluids to keep your pee (urine) clear or pale yellow.  Take warm water baths (sitz baths) to soothe pain or discomfort caused by hemorrhoids. Use hemorrhoid cream if your doctor approves.  If you have puffy, bulging veins (varicose veins), wear support hose. Raise (elevate) your feet for 15 minutes, 3-4 times a day. Limit salt in your diet.  Avoid heavy lifting, wear low heels, and sit up straight.  Rest with your legs raised if you have leg cramps or low back pain.  Visit your dentist if you have not gone during your pregnancy. Use a soft toothbrush to brush your teeth. Be gentle when you floss.  You can have sex (intercourse) unless your doctor tells you not to.  Do not travel far distances unless you must. Only do so with your doctor's approval.  Take prenatal classes.  Practice driving to the hospital.  Pack your hospital bag.  Prepare the baby's room.  Go to your doctor visits. Get help if:  You are not sure if you are in labor or if your water has broken.  You are dizzy.  You have mild cramps or pressure in your lower belly (abdominal).  You have a nagging pain in your belly area.  You continue to feel sick to your stomach (nauseous), throw up (vomit),  or have watery poop (diarrhea).  You have bad smelling fluid coming from your vagina.  You have pain with peeing (urination). Get help right away if:  You have a fever.  You are leaking fluid from your vagina.  You are spotting  or bleeding from your vagina.  You have severe belly cramping or pain.  You lose or gain weight rapidly.  You have trouble catching your breath and have chest pain.  You notice sudden or extreme puffiness (swelling) of your face, hands, ankles, feet, or legs.  You have not felt the baby move in over an hour.  You have severe headaches that do not go away with medicine.  You have vision changes. This information is not intended to replace advice given to you by your health care provider. Make sure you discuss any questions you have with your health care provider. Document Released: 05/19/2009 Document Revised: 07/31/2015 Document Reviewed: 04/25/2012 Elsevier Interactive Patient Education  2017 [employer].

## 2016-05-17 NOTE — Progress Notes (Signed)
ROB- pt is having some pelvic pressure 

## 2016-05-17 NOTE — Progress Notes (Signed)
Priseis presents today for ROB, she denies any complaints. No LOF, bleeding, or contractions. Positive fetal movement. Declines SVE today. Reviewed labor precautions. Return in one week.   Philip Aspen, CNM

## 2016-05-18 ENCOUNTER — Inpatient Hospital Stay
Admission: EM | Admit: 2016-05-18 | Discharge: 2016-05-19 | DRG: 775 | Disposition: A | Discharge status: Home / Self Care | Payer: BLUE CROSS/BLUE SHIELD | Attending: Obstetrics and Gynecology | Admitting: Obstetrics and Gynecology

## 2016-05-18 ENCOUNTER — Inpatient Hospital Stay: Payer: BLUE CROSS/BLUE SHIELD | Admitting: Anesthesiology

## 2016-05-18 DIAGNOSIS — Z3A38 38 weeks gestation of pregnancy: Secondary | ICD-10-CM

## 2016-05-18 DIAGNOSIS — O3413 Maternal care for benign tumor of corpus uteri, third trimester: Secondary | ICD-10-CM | POA: Diagnosis present

## 2016-05-18 DIAGNOSIS — D259 Leiomyoma of uterus, unspecified: Secondary | ICD-10-CM | POA: Diagnosis present

## 2016-05-18 DIAGNOSIS — Z3493 Encounter for supervision of normal pregnancy, unspecified, third trimester: Secondary | ICD-10-CM | POA: Diagnosis present

## 2016-05-18 LAB — CBC
HCT: 36.3 % (ref 35.0–47.0)
Hemoglobin: 12.5 g/dL (ref 12.0–16.0)
MCH: 30.2 pg (ref 26.0–34.0)
MCHC: 34.3 g/dL (ref 32.0–36.0)
MCV: 88.1 fL (ref 80.0–100.0)
PLATELETS: 172 10*3/uL (ref 150–440)
RBC: 4.12 MIL/uL (ref 3.80–5.20)
RDW: 13.7 % (ref 11.5–14.5)
WBC: 13.2 10*3/uL — ABNORMAL HIGH (ref 3.6–11.0)

## 2016-05-18 LAB — TYPE AND SCREEN
ABO/RH(D): B POS
ANTIBODY SCREEN: NEGATIVE

## 2016-05-18 MED ORDER — ONDANSETRON HCL 4 MG/2ML IJ SOLN: 4.0000 mg | INTRAMUSCULAR | Status: DC | PRN

## 2016-05-18 MED ORDER — ONDANSETRON HCL 4 MG/2ML IJ SOLN: 4.0000 mg | Freq: Four times a day (QID) | INTRAMUSCULAR | Status: DC | PRN

## 2016-05-18 MED ORDER — OXYTOCIN 40 UNITS IN LACTATED RINGERS INFUSION - SIMPLE MED: 2.5000 [IU]/h | INTRAVENOUS | Status: DC

## 2016-05-18 MED ORDER — FAMOTIDINE 20 MG PO TABS
20.0000 mg | ORAL_TABLET | Freq: Two times a day (BID) | ORAL | Status: DC
  Administered 2016-05-18 – 2016-05-19 (×2): 20 mg via ORAL
  Filled 2016-05-18 (×3): qty 1

## 2016-05-18 MED ORDER — FENTANYL 2.5 MCG/ML W/ROPIVACAINE 0.2% IN NS 100 ML EPIDURAL INFUSION (ARMC-ANES)
EPIDURAL | Status: DC | PRN
  Administered 2016-05-18: 10 mL/h via EPIDURAL

## 2016-05-18 MED ORDER — PRENATAL MULTIVITAMIN CH
1.0000 | ORAL_TABLET | Freq: Every day | ORAL | Status: DC
  Administered 2016-05-18 – 2016-05-19 (×2): 1 via ORAL
  Filled 2016-05-18 (×2): qty 1

## 2016-05-18 MED ORDER — LIDOCAINE-EPINEPHRINE (PF) 1.5 %-1:200000 IJ SOLN
INTRAMUSCULAR | Status: DC | PRN
  Administered 2016-05-18: 3 mL via EPIDURAL

## 2016-05-18 MED ORDER — LIDOCAINE HCL (PF) 1 % IJ SOLN: 30.0000 mL | INTRAMUSCULAR | Status: DC | PRN

## 2016-05-18 MED ORDER — DIBUCAINE 1 % RE OINT: 1.0000 "application " | TOPICAL_OINTMENT | RECTAL | Status: DC | PRN

## 2016-05-18 MED ORDER — COCONUT OIL OIL
1.0000 "application " | TOPICAL_OIL | Status: DC | PRN
  Administered 2016-05-18: 1 via TOPICAL
  Filled 2016-05-18: qty 120

## 2016-05-18 MED ORDER — AMMONIA AROMATIC IN INHA
RESPIRATORY_TRACT | Status: AC
  Filled 2016-05-18: qty 10

## 2016-05-18 MED ORDER — BENZOCAINE-MENTHOL 20-0.5 % EX AERO
1.0000 "application " | INHALATION_SPRAY | CUTANEOUS | Status: DC | PRN
  Administered 2016-05-18: 1 via TOPICAL
  Filled 2016-05-18: qty 56

## 2016-05-18 MED ORDER — TETANUS-DIPHTH-ACELL PERTUSSIS 5-2.5-18.5 LF-MCG/0.5 IM SUSP: 0.5000 mL | Freq: Once | INTRAMUSCULAR | Status: DC

## 2016-05-18 MED ORDER — OXYTOCIN 10 UNIT/ML IJ SOLN
INTRAMUSCULAR | Status: AC
  Filled 2016-05-18: qty 2

## 2016-05-18 MED ORDER — EPHEDRINE 5 MG/ML INJ: 10.0000 mg | INTRAVENOUS | Status: DC | PRN

## 2016-05-18 MED ORDER — LIDOCAINE HCL (PF) 1 % IJ SOLN
INTRAMUSCULAR | Status: DC | PRN
  Administered 2016-05-18: 1.5 mL

## 2016-05-18 MED ORDER — PHENYLEPHRINE 40 MCG/ML (10ML) SYRINGE FOR IV PUSH (FOR BLOOD PRESSURE SUPPORT): 80.0000 ug | PREFILLED_SYRINGE | INTRAVENOUS | Status: DC | PRN

## 2016-05-18 MED ORDER — DOCUSATE SODIUM 100 MG PO CAPS
100.0000 mg | ORAL_CAPSULE | Freq: Two times a day (BID) | ORAL | Status: DC
  Administered 2016-05-18 – 2016-05-19 (×2): 100 mg via ORAL
  Filled 2016-05-18 (×2): qty 1

## 2016-05-18 MED ORDER — OXYCODONE-ACETAMINOPHEN 5-325 MG PO TABS: 1.0000 | ORAL_TABLET | ORAL | Status: DC | PRN

## 2016-05-18 MED ORDER — LACTATED RINGERS IV SOLN
500.0000 mL | INTRAVENOUS | Status: DC | PRN
Start: 2016-05-18 — End: 2016-05-18

## 2016-05-18 MED ORDER — DIPHENHYDRAMINE HCL 50 MG/ML IJ SOLN: 12.5000 mg | INTRAMUSCULAR | Status: DC | PRN

## 2016-05-18 MED ORDER — OXYCODONE-ACETAMINOPHEN 5-325 MG PO TABS: 2.0000 | ORAL_TABLET | ORAL | Status: DC | PRN

## 2016-05-18 MED ORDER — SOD CITRATE-CITRIC ACID 500-334 MG/5ML PO SOLN: 30.0000 mL | ORAL | Status: DC | PRN

## 2016-05-18 MED ORDER — MISOPROSTOL 200 MCG PO TABS
ORAL_TABLET | ORAL | Status: AC
  Filled 2016-05-18: qty 4

## 2016-05-18 MED ORDER — SIMETHICONE 80 MG PO CHEW: 80.0000 mg | CHEWABLE_TABLET | ORAL | Status: DC | PRN

## 2016-05-18 MED ORDER — ACETAMINOPHEN 325 MG PO TABS: 650.0000 mg | ORAL_TABLET | ORAL | Status: DC | PRN

## 2016-05-18 MED ORDER — OXYTOCIN 40 UNITS IN LACTATED RINGERS INFUSION - SIMPLE MED
INTRAVENOUS | Status: AC
  Filled 2016-05-18: qty 1000

## 2016-05-18 MED ORDER — ONDANSETRON HCL 4 MG PO TABS: 4.0000 mg | ORAL_TABLET | ORAL | Status: DC | PRN

## 2016-05-18 MED ORDER — OXYTOCIN BOLUS FROM INFUSION: 500.0000 mL | Freq: Once | INTRAVENOUS | Status: DC

## 2016-05-18 MED ORDER — LIDOCAINE HCL (PF) 1 % IJ SOLN
INTRAMUSCULAR | Status: AC
Start: 2016-05-18 — End: 2016-05-18
  Filled 2016-05-18: qty 30

## 2016-05-18 MED ORDER — WITCH HAZEL-GLYCERIN EX PADS: 1.0000 "application " | MEDICATED_PAD | CUTANEOUS | Status: DC | PRN

## 2016-05-18 MED ORDER — FERROUS SULFATE 325 (65 FE) MG PO TABS
325.0000 mg | ORAL_TABLET | Freq: Every day | ORAL | Status: DC
  Administered 2016-05-19: 325 mg via ORAL
  Filled 2016-05-18: qty 1

## 2016-05-18 MED ORDER — LACTATED RINGERS IV SOLN: 500.0000 mL | Freq: Once | INTRAVENOUS | Status: DC

## 2016-05-18 MED ORDER — FENTANYL 2.5 MCG/ML W/ROPIVACAINE 0.2% IN NS 100 ML EPIDURAL INFUSION (ARMC-ANES)
EPIDURAL | Status: AC
  Filled 2016-05-18: qty 100

## 2016-05-18 MED ORDER — FENTANYL 2.5 MCG/ML W/ROPIVACAINE 0.2% IN NS 100 ML EPIDURAL INFUSION (ARMC-ANES): 10.0000 mL/h | EPIDURAL | Status: DC

## 2016-05-18 MED ORDER — IBUPROFEN 600 MG PO TABS
600.0000 mg | ORAL_TABLET | Freq: Four times a day (QID) | ORAL | Status: DC
  Administered 2016-05-18 – 2016-05-19 (×5): 600 mg via ORAL
  Filled 2016-05-18 (×5): qty 1

## 2016-05-18 MED ORDER — LACTATED RINGERS IV SOLN: INTRAVENOUS | Status: DC

## 2016-05-18 MED ORDER — ZOLPIDEM TARTRATE 5 MG PO TABS: 5.0000 mg | ORAL_TABLET | Freq: Every evening | ORAL | Status: DC | PRN

## 2016-05-18 NOTE — Anesthesia Preprocedure Evaluation (Signed)
Anesthesia Evaluation  Patient identified by MRN, date of birth, ID band Patient awake    Reviewed: Allergy & Precautions, Patient's Chart, lab work & pertinent test results  History of Anesthesia Complications Negative for: history of anesthetic complications  Airway Mallampati: II       Dental   Pulmonary neg pulmonary ROS,           Cardiovascular negative cardio ROS       Neuro/Psych negative neurological ROS     GI/Hepatic negative GI ROS, Neg liver ROS,   Endo/Other  negative endocrine ROS  Renal/GU negative Renal ROS     Musculoskeletal   Abdominal   Peds  Hematology negative hematology ROS (+)   Anesthesia Other Findings   Reproductive/Obstetrics (+) Pregnancy                             Anesthesia Physical Anesthesia Plan  ASA: II  Anesthesia Plan: Epidural   Post-op Pain Management:    Induction:   Airway Management Planned:   Additional Equipment:   Intra-op Plan:   Post-operative Plan:   Informed Consent: I have reviewed the patients History and Physical, chart, labs and discussed the procedure including the risks, benefits and alternatives for the proposed anesthesia with the patient or authorized representative who has indicated his/her understanding and acceptance.     Plan Discussed with:   Anesthesia Plan Comments:         Anesthesia Quick Evaluation

## 2016-05-18 NOTE — H&P (Signed)
@LOGO @  History and Physical   HPI  Renee Reyes is a 31 y.o. G2P1001 at [redacted]w[redacted]d Estimated Date of Delivery: 05/28/16 who is being admitted for labor management. She presented to [facility] L[facility] in active cm at 7-8cm.    OB History  Obstetric History   G2   P1   T1   P0   A0   L1    SAB0   TAB0   Ectopic0   Multiple0   Live Births1     # Outcome Date GA Lbr Len/2nd Weight Sex Delivery Anes PTL Lv  2 Current           1 Term 05/21/14   6.3 oz (0.179 kg) M Vag-Spont  N LIV      PROBLEM LIST  Pregnancy complications or risks: Patient Active Problem List   Diagnosis Date Noted  . Labor and delivery, indication for care 05/18/2016    Prenatal labs and studies: ABO, Rh: --/--/PENDING (03/13 0152) Antibody: Negative (08/24 1006) Rubella: 1.36 (08/24 1006) RPR: Non Reactive (08/24 1006)  HBsAg: Negative (08/24 1006)  HIV: Non Reactive (08/24 1006)  GBS: Negative 1 hr Glucola  126 mg/dL Genetic screening Low risk, female Anatomy US Bilateral choroid plexus cysts. 2 small fibroids on the posterior uterus  Prenatal Transfer Tool  Maternal Diabetes: No Genetic Screening: Declined Maternal Ultrasounds/Referrals: Abnormal:  Findings:   Other: Bilateral choroid plexus cysts.   2 small fibroids on the posterior uterus.  Fetal Ultrasounds or other Referrals:  None Maternal Substance Abuse:  No Significant Maternal Medications:  None Significant Maternal Lab Results: None   Past Medical History:  Diagnosis Date  . Medical history non-contributory      Past Surgical History:  Procedure Laterality Date  . NO PAST SURGERIES       Medications    Current Discharge Medication List    CONTINUE these medications which have NOT CHANGED   Details  Prenatal Vit-Fe Fumarate-FA (PRENATAL MULTIVITAMIN) TABS tablet Take 1 tablet by mouth daily at 12 noon.    ranitidine (ZANTAC) 150 MG tablet Take 1 tablet (150 mg total) by mouth 2 (two) times daily. Qty: 60 tablet, Refills: 3          Allergies  Patient has no known allergies.  Review of Systems  Constitutional: negative Eyes: negative Ears, nose, mouth, throat, and face: negative Respiratory: negative Cardiovascular: negative Gastrointestinal: negative Genitourinary:negative Integument/breast: negative Hematologic/lymphatic: negative Musculoskeletal:negative Neurological: negative Behavioral/Psych: negative Endocrine: negative  Physical Exam  BP 125/82 (BP Location: Left Arm)   Pulse 100   Temp 98.1 F (36.7 C) (Oral)   Resp 18   Ht 5\' 3"  (1.6 m)   Wt 156 lb (70.8 kg)   LMP 08/22/2015 (Exact Date)   SpO2 100%   BMI 27.63 kg/m   Lungs:  Clear To Auscultation  Bilaterally Cardio: Regular Rate Rhythm without M/R/G Abd: Soft, gravid, NT Presentation: cephalic EXT: No C/C/ 1+ Edema DTRs: 2+ B CERVIX: 7-8 cm per RN exam:{  See Prenatal records for more detailed PE.   FHR:  Baseline: 140'S bpm, Variability: Good {> 6 bpm), Accelerations: Reactive and Decelerations: Variable: moderate  Toco: Uterine Contractions: Frequency: Every 3-4 minutes   Test Results  Results for orders placed or performed during the hospital encounter of 05/18/16 (from the past 24 hour(s))  CBC     Status: Abnormal   Collection Time: 05/18/16  1:52 AM  Result Value Ref Range   WBC 13.2 (H) 3.6 - 11.0 K/uL  RBC 4.12 3.80 - 5.20 MIL/uL   Hemoglobin 12.5 12.0 - 16.0 g/dL   HCT 36.3 35.0 - 47.0 %   MCV 88.1 80.0 - 100.0 fL   MCH 30.2 26.0 - 34.0 pg   MCHC 34.3 32.0 - 36.0 g/dL   RDW 13.7 11.5 - 14.5 %   Platelets 172 150 - 440 K/uL   Group B Strep negative  Assessment   G2P1001 at [redacted]w[redacted]d Estimated Date of Delivery: 05/28/16  The fetus is reassuring with moderate variability and accelerations present.  Patient Active Problem List   Diagnosis Date Noted  . Labor and delivery, indication for care 05/18/2016    Plan  1. Admit to L&D for continue present management and anticipate vaginal delivery 2.  EFM:-- Category 2 3. Epidural if desired. Stadol for IV pain until epidural requested. 4. Admission labs    Philip Aspen, [place] 05/18/2016 2:23 AM   I have reviewed the record and concur with patient management and plan. Rondall Radigan, Hassell Done, MD, Cherlynn June

## 2016-05-18 NOTE — Anesthesia Procedure Notes (Signed)
Epidural Patient location during procedure: OB Start time: 05/18/2016 2:18 AM End time: 05/18/2016 2:43 AM  Staffing Performed: anesthesiologist   Preanesthetic Checklist Completed: patient identified, site marked, surgical consent, pre-op evaluation, timeout performed, IV checked, risks and benefits discussed and monitors and equipment checked  Epidural Patient position: sitting Prep: Betadine Patient monitoring: heart rate, continuous pulse ox and blood pressure Approach: midline Location: L4-L5 Injection technique: LOR saline  Needle:  Needle type: Tuohy  Needle gauge: 17 G Needle length: 9 cm and 9 Needle insertion depth: 8 cm Catheter type: closed end flexible Catheter size: 20 Guage Catheter at skin depth: 11 cm Test dose: negative and 1.5% lidocaine with Epi 1:200 K  Assessment Events: blood not aspirated, injection not painful, no injection resistance, negative IV test and no paresthesia  Additional Notes   Patient tolerated the insertion well without complications.Reason for block:procedure for pain

## 2016-05-18 NOTE — Progress Notes (Addendum)
Patient requests epidural:  MD:  Ronelle Nigh  MD notified:  1:57  MD in room:  2:18  1% local - 2:26  Test dose given by MD:  2:31  Maternal heart rate:  99  Bolus 1 given by MD:  2:36  Bolus 2 given by MD:  2:38  Drip started:  2:39

## 2016-05-19 LAB — CBC
HEMATOCRIT: 31.3 % — AB (ref 35.0–47.0)
HEMOGLOBIN: 10.8 g/dL — AB (ref 12.0–16.0)
MCH: 30.5 pg (ref 26.0–34.0)
MCHC: 34.5 g/dL (ref 32.0–36.0)
MCV: 88.3 fL (ref 80.0–100.0)
Platelets: 166 10*3/uL (ref 150–440)
RBC: 3.54 MIL/uL — AB (ref 3.80–5.20)
RDW: 13.5 % (ref 11.5–14.5)
WBC: 14.1 10*3/uL — AB (ref 3.6–11.0)

## 2016-05-19 LAB — RPR: RPR: NONREACTIVE

## 2016-05-19 MED ORDER — WITCH HAZEL-GLYCERIN EX PADS: 1.0000 "application " | MEDICATED_PAD | CUTANEOUS | 12 refills | Status: DC | PRN

## 2016-05-19 MED ORDER — BENZOCAINE-MENTHOL 20-0.5 % EX AERO: 1.0000 "application " | INHALATION_SPRAY | CUTANEOUS | 0 refills | Status: DC | PRN

## 2016-05-19 MED ORDER — FERROUS SULFATE 325 (65 FE) MG PO TABS: 325.0000 mg | ORAL_TABLET | Freq: Every day | ORAL | 3 refills | Status: DC

## 2016-05-19 NOTE — Discharge Summary (Signed)
Discharge Summary  Date of Admission: 05/18/2016  Date of Discharge: 05/19/16  Admitting Diagnosis: Onset of Labor at [redacted]w[redacted]d  Secondary Diagnosis: Uterine fibroids  Mode of Delivery: normal spontaneous vaginal delivery     Discharge Diagnosis: NSVD   Intrapartum Procedures: Atificial rupture of membranes, epidural and laceration 2nd   Post partum procedures: none  Complications: 2nd  degree perineal laceration                      Discharge Day SOAP Note:  Progress Note - Vaginal Delivery  Renee Reyes is a 31 y.o. G2P2002 now PP day 1 s/p Vaginal, Spontaneous Delivery . Delivery was uncomplicated  Subjective  The patient has the following complaints: has no unusual complaints  Pain is controlled with current medications.   Patient is urinating without difficulty.  She is ambulating well.    Objective  Vital signs: BP 125/72   Pulse 69   Temp 97.9 F (36.6 C) (Oral)   Resp 18   Ht 5\' 3"  (1.6 m)   Wt 156 lb (70.8 kg)   LMP 08/22/2015 (Exact Date)   SpO2 98%   Breastfeeding? Unknown   BMI 27.63 kg/m   Physical Exam: Gen: NAD Fundus Fundal Tone: Firm  Lochia Amount: Small  Perineum Appearance: Approximated, Edematous     Data Review Labs: CBC Latest Ref Rng & Units 05/19/2016 05/18/2016 03/09/2016  WBC 3.6 - 11.0 K/uL 14.1(H) 13.2(H) -  Hemoglobin 12.0 - 16.0 g/dL 10.8(L) 12.5 -  Hematocrit 35.0 - 47.0 % 31.3(L) 36.3 38.0  Platelets 150 - 440 K/uL 166 172 -   B POS  Assessment/Plan  Active Problems:   Labor and delivery, indication for care    Plan for discharge today.   Discharge Instructions: Per After Visit Summary. Activity: Advance as tolerated. Pelvic rest for 6 weeks.  Also refer to After Visit Summary Diet: Regular Medications: Allergies as of 05/19/2016   No Known Allergies     Medication List    TAKE these medications   benzocaine-Menthol 20-0.5 % Aero Commonly known as:  DERMOPLAST Apply 1 application  topically as needed for irritation (perineal discomfort).   ferrous sulfate 325 (65 FE) MG tablet Take 1 tablet (325 mg total) by mouth daily with breakfast. Start taking on:  05/20/2016   prenatal multivitamin Tabs tablet Take 1 tablet by mouth daily at 12 noon.   ranitidine 150 MG tablet Commonly known as:  ZANTAC Take 1 tablet (150 mg total) by mouth 2 (two) times daily.   witch hazel-glycerin pad Commonly known as:  TUCKS Apply 1 application topically as needed for hemorrhoids.      Outpatient follow up:  Postpartum contraception: IUD, nothing in the vaginal for 6 weeks  Discharged Condition: good  Discharged to: home follow up in 6 weeks  Newborn Data: Disposition:home with mother  Apgars: APGAR (1 MIN): 8   APGAR (5 MINS): 9   APGAR (10 MINS):    Baby Feeding: Breast    Philip Aspen, CNM 05/19/2016 9:21 AM   I have reviewed the record and concur with patient management and plan. Ellyson Rarick, Hassell Done, MD, Cherlynn June

## 2016-05-19 NOTE — Final Progress Note (Signed)
See discharge Summary for full details  Renee Reyes , requests to go home today. She denies any complaints. Ambulating and voiding without difficulty. Has not had a bowel movement yet. Reviewed use of stool softer and encouraged adequate PO hydration. She is breastfeeding well, milk is not in yet.  Taking Ibuprofen to manage pain. Reviewed Postpartum discharge instructions. Plans of IUD for contraception in 6 weeks.   Subjective:  Review of Systems  Constitutional: Negative.   HENT: Negative.   Eyes: Negative.   Respiratory: Negative.   Cardiovascular: Negative.   Gastrointestinal: Negative.   Genitourinary: Negative.   Musculoskeletal: Negative.   Skin: Negative.   Neurological: Negative.   Endo/Heme/Allergies: Negative.   Psychiatric/Behavioral: Negative.     Objective: Physical Exam  Constitutional: She is oriented to person, place, and time. She appears well-developed and well-nourished.  Genitourinary: Vaginal discharge found.  Genitourinary Comments: Second degree laceration, well approximated, scant rubra   HENT:  Head: Normocephalic and atraumatic.  Eyes: Conjunctivae and EOM are normal.  Neck: Normal range of motion. Neck supple.  Cardiovascular: Normal rate and regular rhythm.   Pulmonary/Chest: Effort normal and breath sounds normal.  Abdominal: Soft. Bowel sounds are normal.  Musculoskeletal: Normal range of motion. She exhibits no tenderness.  Neurological: She is alert and oriented to person, place, and time.  Skin: Skin is warm and dry.  Psychiatric: She has a normal mood and affect.   Assessment:  NSVD  Plan: Discharge home in stable condition, follow up in 6 weeks    Philip Aspen, CNM

## 2016-05-19 NOTE — Discharge Instructions (Signed)

## 2016-05-19 NOTE — Progress Notes (Signed)
Patient discharged home with infant and significant other. Discharge instructions, prescriptions and follow up appointment given to and reviewed with patient and significant other. Patient verbalized understanding. Escorted out via wheelchair by auxiliary.  

## 2016-05-19 NOTE — Anesthesia Postprocedure Evaluation (Signed)
Anesthesia Post Note  Patient: Renee Reyes  Procedure(s) Performed: * No procedures listed *  Patient location during evaluation: Mother Baby Anesthesia Type: Epidural Level of consciousness: awake and alert and oriented Pain management: pain level controlled Vital Signs Assessment: post-procedure vital signs reviewed and stable Respiratory status: spontaneous breathing Cardiovascular status: stable Postop Assessment: no headache, no signs of nausea or vomiting and adequate PO intake Anesthetic complications: no     Last Vitals:  Vitals:   05/19/16 0005 05/19/16 0800  BP: 131/70 125/72  Pulse: 79 69  Resp: 20 18  Temp: 36.4 C 36.6 C    Last Pain:  Vitals:   05/19/16 0940  TempSrc:   PainSc: 4                  [name],  [name]

## 2016-05-24 ENCOUNTER — Encounter: Payer: BLUE CROSS/BLUE SHIELD | Admitting: Certified Nurse Midwife

## 2016-06-14 ENCOUNTER — Other Ambulatory Visit: Payer: Self-pay | Admitting: Obstetrics and Gynecology

## 2016-06-29 ENCOUNTER — Other Ambulatory Visit: Payer: Self-pay | Admitting: Certified Nurse Midwife

## 2016-06-29 ENCOUNTER — Other Ambulatory Visit: Payer: Self-pay

## 2016-06-29 ENCOUNTER — Encounter: Payer: Self-pay | Admitting: Certified Nurse Midwife

## 2016-06-29 ENCOUNTER — Ambulatory Visit (INDEPENDENT_AMBULATORY_CARE_PROVIDER_SITE_OTHER): Payer: BLUE CROSS/BLUE SHIELD | Admitting: Certified Nurse Midwife

## 2016-06-29 VITALS — BP 123/83 | HR 77 | Wt 136.1 lb

## 2016-06-29 DIAGNOSIS — O480 Post-term pregnancy: Secondary | ICD-10-CM

## 2016-06-29 NOTE — Patient Instructions (Signed)
Postpartum Depression and Baby Blues The postpartum period begins right after the birth of a baby. During this time, there is often a great amount of joy and excitement. It is also a time of many changes in the life of the parents. Regardless of how many times a mother gives birth, each child brings new challenges and dynamics to the family. It is not unusual to have feelings of excitement along with confusing shifts in moods, emotions, and thoughts. All mothers are at risk of developing postpartum depression or the "baby blues." These mood changes can occur right after giving birth, or they may occur many months after giving birth. The baby blues or postpartum depression can be mild or severe. Additionally, postpartum depression can go away rather quickly, or it can be a long-term condition. What are the causes? Raised hormone levels and the rapid drop in those levels are thought to be a main cause of postpartum depression and the baby blues. A number of hormones change during and after pregnancy. Estrogen and progesterone usually decrease right after the delivery of your baby. The levels of thyroid hormone and various cortisol steroids also rapidly drop. Other factors that play a role in these mood changes include major life events and genetics. What increases the risk? If you have any of the following risks for the baby blues or postpartum depression, know what symptoms to watch out for during the postpartum period. Risk factors that may increase the likelihood of getting the baby blues or postpartum depression include:  Having a personal or family history of depression.  Having depression while being pregnant.  Having premenstrual mood issues or mood issues related to oral contraceptives.  Having a lot of life stress.  Having marital conflict.  Lacking a social support network.  Having a baby with special needs.  Having health problems, such as diabetes.  What are the signs or  symptoms? Symptoms of baby blues include:  Brief changes in mood, such as going from extreme happiness to sadness.  Decreased concentration.  Difficulty sleeping.  Crying spells, tearfulness.  Irritability.  Anxiety.  Symptoms of postpartum depression typically begin within the first month after giving birth. These symptoms include:  Difficulty sleeping or excessive sleepiness.  Marked weight loss.  Agitation.  Feelings of worthlessness.  Lack of interest in activity or food.  Postpartum psychosis is a very serious condition and can be dangerous. Fortunately, it is rare. Displaying any of the following symptoms is cause for immediate medical attention. Symptoms of postpartum psychosis include:  Hallucinations and delusions.  Bizarre or disorganized behavior.  Confusion or disorientation.  How is this diagnosed? A diagnosis is made by an evaluation of your symptoms. There are no medical or lab tests that lead to a diagnosis, but there are various questionnaires that a health care provider may use to identify those with the baby blues, postpartum depression, or psychosis. Often, a screening tool called the Edinburgh Postnatal Depression Scale is used to diagnose depression in the postpartum period. How is this treated? The baby blues usually goes away on its own in 1-2 weeks. Social support is often all that is needed. You will be encouraged to get adequate sleep and rest. Occasionally, you may be given medicines to help you sleep. Postpartum depression requires treatment because it can last several months or longer if it is not treated. Treatment may include individual or group therapy, medicine, or both to address any social, physiological, and psychological factors that may play a role in the   depression. Regular exercise, a healthy diet, rest, and social support may also be strongly recommended. Postpartum psychosis is more serious and needs treatment right away.  Hospitalization is often needed. Follow these instructions at home:  Get as much rest as you can. Nap when the baby sleeps.  Exercise regularly. Some women find yoga and walking to be beneficial.  Eat a balanced and nourishing diet.  Do little things that you enjoy. Have a cup of tea, take a bubble bath, read your favorite magazine, or listen to your favorite music.  Avoid alcohol.  Ask for help with household chores, cooking, grocery shopping, or running errands as needed. Do not try to do everything.  Talk to people close to you about how you are feeling. Get support from your partner, family members, friends, or other new moms.  Try to stay positive in how you think. Think about the things you are grateful for.  Do not spend a lot of time alone.  Only take over-the-counter or prescription medicine as directed by your health care provider.  Keep all your postpartum appointments.  Let your health care provider know if you have any concerns. Contact a health care provider if: You are having a reaction to or problems with your medicine. Get help right away if:  You have suicidal feelings.  You think you may harm the baby or someone else. This information is not intended to replace advice given to you by your health care provider. Make sure you discuss any questions you have with your health care provider. Document Released: 11/27/2003 Document Revised: 07/31/2015 Document Reviewed: 12/04/2012 Elsevier Interactive Patient Education  2017 [employer]  

## 2016-06-29 NOTE — Progress Notes (Signed)
Subjective:     Renee Reyes is a 31 y.o. female who presents for a postpartum visit. She is 6 weeks postpartum following a spontaneous vaginal delivery. I have fully reviewed the prenatal and intrapartum course. The delivery was at 38.4 gestational weeks. Outcome: spontaneous vaginal delivery. Anesthesia: local and epidural. Postpartum course has been normal. Baby's course has been normal. Baby is feeding by breast. Bleeding no bleeding. Bowel function is normal. Bladder function is normal. Patient is sexually active. Contraception method is coitus interruptus.She is unsure about birth control . Reviewed birth control options. Shewould like IUD placement. Postpartum depression screening: negative.  The following portions of the patient's history were reviewed and updated as appropriate: allergies, current medications, past family history, past medical history, past social history, past surgical history and problem list.  Review of Systems Constitutional: negative Eyes: negative Ears, nose, mouth, throat, and face: negative Respiratory: negative Cardiovascular: negative Gastrointestinal: negative Genitourinary:negative Integument/breast: negative Hematologic/lymphatic: negative Musculoskeletal:negative Neurological: negative Behavioral/Psych: negative Endocrine: negative Allergic/Immunologic: negative   Objective:    BP 123/83   Pulse 77   Wt 136 lb 1.6 oz (61.7 kg)   Breastfeeding? Yes   BMI 24.11 kg/m   General:  alert, cooperative and appears stated age   Breasts:  inspection negative, no nipple discharge or bleeding, no masses or nodularity palpable  Lungs: clear to auscultation bilaterally  Heart:  regular rate and rhythm  Abdomen: normal findings: bowel sounds normal and soft, non-tender   Vulva:  not evaluated  Vagina: not evaluated  Cervix:  multiporus   Corpus: not examined  Adnexa:  not evaluated  Rectal Exam: Not performed.        Assessment:    Normal  postpartum exam. Pap smear not indicated  at today's visit.   Plan:    1. Contraception: coitus interruptus, would like IUD. Counseled on benefits and risk of IUD  2.  Encouraged condom use in the interim. 3. Follow up in: 2 weeks or as needed.    Philip Aspen, CNM

## 2016-06-30 LAB — VITAMIN D 25 HYDROXY (VIT D DEFICIENCY, FRACTURES): Vit D, 25-Hydroxy: 34.2 ng/mL (ref 30.0–100.0)

## 2016-06-30 LAB — HEMOGLOBIN AND HEMATOCRIT, BLOOD
HEMATOCRIT: 41.6 % (ref 34.0–46.6)
HEMOGLOBIN: 14.1 g/dL (ref 11.1–15.9)

## 2016-07-16 ENCOUNTER — Ambulatory Visit (INDEPENDENT_AMBULATORY_CARE_PROVIDER_SITE_OTHER): Payer: BLUE CROSS/BLUE SHIELD | Admitting: Certified Nurse Midwife

## 2016-07-16 ENCOUNTER — Encounter: Payer: Self-pay | Admitting: Certified Nurse Midwife

## 2016-07-16 VITALS — BP 144/97 | HR 73 | Wt 136.1 lb

## 2016-07-16 DIAGNOSIS — Z538 Procedure and treatment not carried out for other reasons: Secondary | ICD-10-CM | POA: Insufficient documentation

## 2016-07-16 LAB — POCT URINE PREGNANCY: PREG TEST UR: NEGATIVE

## 2016-07-16 MED ORDER — NORETHINDRONE 0.35 MG PO TABS: 1.0000 | ORAL_TABLET | Freq: Every day | ORAL | 11 refills | Status: DC

## 2016-07-16 NOTE — Progress Notes (Signed)
Pt is here for an IUD insertion. Is breast feeding. Informed consent signed.

## 2016-07-16 NOTE — Progress Notes (Signed)
  GYNECOLOGY OFFICE PROCEDURE NOTE  Renee Reyes is a 30 y.o. [phone] here for [place] IUD insertion. No GYN concerns.  Last pap smear was on  11/12/14 and was normal. Pt is 8 wks postpartum.  IUD Insertion Procedure Note Patient identified, informed consent performed, consent signed.   Discussed risks of irregular bleeding, cramping, infection, malpositioning or misplacement of the IUD outside the uterus which may require further procedure such as laparoscopy. Time out was performed.  Urine pregnancy test negative.  Speculum placed in the vagina.  Cervix visualized.  Cleaned with Betadine x 2.  Grasped anteriorly with a single tooth tenaculum.  Uterus sounded to 14 cm.  Due to increased size of uterus unable to place IUD at this time. Patient tolerated procedure well.   Patient was given post-procedure instructions.  Discussed plan of care and she has opted to do progestin only pills while breast feeding. Reviewed red flag warning signs. She was advised to use condoms for 2 wks for pregnancy protection.  Follow up as needed.    Philip Aspen, CNM

## 2016-07-16 NOTE — Patient Instructions (Signed)
Oral Contraception Information Oral contraceptive pills (OCPs) are medicines taken to prevent pregnancy. OCPs work by preventing the ovaries from releasing eggs. The hormones in OCPs also cause the cervical mucus to thicken, preventing the sperm from entering the uterus. The hormones also cause the uterine lining to become thin, not allowing a fertilized egg to attach to the inside of the uterus. OCPs are highly effective when taken exactly as prescribed. However, OCPs do not prevent sexually transmitted diseases (STDs). Safe sex practices, such as using condoms along with the pill, can help prevent STDs. Before taking the pill, you may have a physical exam and Pap test. Your health care provider may order blood tests. The health care provider will make sure you are a good candidate for oral contraception. Discuss with your health care provider the possible side effects of the OCP you may be prescribed. When starting an OCP, it can take 2 to 3 months for the body to adjust to the changes in hormone levels in your body. Types of oral contraception  The combination pill-This pill contains estrogen and progestin (synthetic progesterone) hormones. The combination pill comes in 21-day, 28-day, or 91-day packs. Some types of combination pills are meant to be taken continuously (365-day pills). With 21-day packs, you do not take pills for 7 days after the last pill. With 28-day packs, the pill is taken every day. The last 7 pills are without hormones. Certain types of pills have more than 21 hormone-containing pills. With 91-day packs, the first 84 pills contain both hormones, and the last 7 pills contain no hormones or contain estrogen only.  The minipill-This pill contains the progesterone hormone only. The pill is taken every day continuously. It is very important to take the pill at the same time each day. The minipill comes in packs of 28 pills. All 28 pills contain the hormone. Advantages of oral  contraceptive pills  Decreases premenstrual symptoms.  Treats menstrual period cramps.  Regulates the menstrual cycle.  Decreases a heavy menstrual flow.  May treatacne, depending on the type of pill.  Treats abnormal uterine bleeding.  Treats polycystic ovarian syndrome.  Treats endometriosis.  Can be used as emergency contraception. Things that can make oral contraceptive pills less effective OCPs can be less effective if:  You forget to take the pill at the same time every day.  You have a stomach or intestinal disease that lessens the absorption of the pill.  You take OCPs with other medicines that make OCPs less effective, such as antibiotics, certain HIV medicines, and some seizure medicines.  You take expired OCPs.  You forget to restart the pill on day 7, when using the packs of 21 pills. Risks associated with oral contraceptive pills Oral contraceptive pills can sometimes cause side effects, such as:  Headache.  Nausea.  Breast tenderness.  Irregular bleeding or spotting. Combination pills are also associated with a small increased risk of:  Blood clots.  Heart attack.  Stroke. This information is not intended to replace advice given to you by your health care provider. Make sure you discuss any questions you have with your health care provider. Document Released: 05/15/2002 Document Revised: 07/31/2015 Document Reviewed: 08/13/2012 Elsevier Interactive Patient Education  2017 [employer].

## 2016-07-16 NOTE — Addendum Note (Signed)
Addended by: Hildred Priest on: 07/16/2016 04:11 PM   Modules accepted: Level of Service

## 2016-12-15 ENCOUNTER — Telehealth: Payer: Self-pay | Admitting: Certified Nurse Midwife

## 2016-12-15 ENCOUNTER — Encounter: Payer: Self-pay | Admitting: Certified Nurse Midwife

## 2016-12-15 ENCOUNTER — Other Ambulatory Visit: Payer: Self-pay | Admitting: Certified Nurse Midwife

## 2016-12-15 MED ORDER — NORETHINDRONE 0.35 MG PO TABS: 1.0000 | ORAL_TABLET | Freq: Every day | ORAL | 4 refills | Status: DC

## 2016-12-15 NOTE — Telephone Encounter (Signed)
Patient called requesting her birth control be switched to #90 so that she can get it for free with her insurance. Thanks

## 2016-12-15 NOTE — Progress Notes (Signed)
Pt request refill for 3 months . Order placed.   Philip Aspen, CNM

## 2017-04-16 ENCOUNTER — Encounter: Payer: Self-pay | Admitting: Emergency Medicine

## 2017-04-16 DIAGNOSIS — R197 Diarrhea, unspecified: Secondary | ICD-10-CM | POA: Insufficient documentation

## 2017-04-16 DIAGNOSIS — Z79899 Other long term (current) drug therapy: Secondary | ICD-10-CM | POA: Insufficient documentation

## 2017-04-16 DIAGNOSIS — Z880 Allergy status to penicillin: Secondary | ICD-10-CM | POA: Diagnosis not present

## 2017-04-16 DIAGNOSIS — R112 Nausea with vomiting, unspecified: Secondary | ICD-10-CM | POA: Insufficient documentation

## 2017-04-16 DIAGNOSIS — Z8619 Personal history of other infectious and parasitic diseases: Secondary | ICD-10-CM | POA: Diagnosis not present

## 2017-04-16 MED ORDER — ONDANSETRON HCL 4 MG/2ML IJ SOLN
INTRAMUSCULAR | Status: AC
  Filled 2017-04-16: qty 2

## 2017-04-16 NOTE — ED Triage Notes (Addendum)
Patient with complaint of vomiting and diarrhea that started about 7 hours ago. Patient with history of cdiff 2 years ago. Patient denies abd pain.

## 2017-04-17 ENCOUNTER — Emergency Department
Admission: EM | Admit: 2017-04-17 | Discharge: 2017-04-17 | Disposition: A | Discharge status: Home / Self Care | Payer: BLUE CROSS/BLUE SHIELD | Attending: Emergency Medicine | Admitting: Emergency Medicine

## 2017-04-17 DIAGNOSIS — R112 Nausea with vomiting, unspecified: Secondary | ICD-10-CM

## 2017-04-17 DIAGNOSIS — R197 Diarrhea, unspecified: Secondary | ICD-10-CM

## 2017-04-17 HISTORY — DX: Enterocolitis due to Clostridium difficile, not specified as recurrent: A04.72

## 2017-04-17 LAB — CBC WITH DIFFERENTIAL/PLATELET
Basophils Absolute: 0 10*3/uL (ref 0–0.1)
Basophils Relative: 0 %
EOS PCT: 1 %
Eosinophils Absolute: 0.1 10*3/uL (ref 0–0.7)
HCT: 43.4 % (ref 35.0–47.0)
Hemoglobin: 14.6 g/dL (ref 12.0–16.0)
LYMPHS ABS: 1.2 10*3/uL (ref 1.0–3.6)
LYMPHS PCT: 11 %
MCH: 28.8 pg (ref 26.0–34.0)
MCHC: 33.6 g/dL (ref 32.0–36.0)
MCV: 86 fL (ref 80.0–100.0)
MONOS PCT: 8 %
Monocytes Absolute: 1 10*3/uL — ABNORMAL HIGH (ref 0.2–0.9)
Neutro Abs: 9.1 10*3/uL — ABNORMAL HIGH (ref 1.4–6.5)
Neutrophils Relative %: 80 %
PLATELETS: 198 10*3/uL (ref 150–440)
RBC: 5.05 MIL/uL (ref 3.80–5.20)
RDW: 13.4 % (ref 11.5–14.5)
WBC: 11.4 10*3/uL — AB (ref 3.6–11.0)

## 2017-04-17 LAB — COMPREHENSIVE METABOLIC PANEL
ALK PHOS: 80 U/L (ref 38–126)
ALT: 19 U/L (ref 14–54)
AST: 27 U/L (ref 15–41)
Albumin: 4.6 g/dL (ref 3.5–5.0)
Anion gap: 14 (ref 5–15)
BUN: 28 mg/dL — ABNORMAL HIGH (ref 6–20)
CALCIUM: 9.7 mg/dL (ref 8.9–10.3)
CO2: 19 mmol/L — ABNORMAL LOW (ref 22–32)
CREATININE: 0.81 mg/dL (ref 0.44–1.00)
Chloride: 106 mmol/L (ref 101–111)
Glucose, Bld: 104 mg/dL — ABNORMAL HIGH (ref 65–99)
Potassium: 3.7 mmol/L (ref 3.5–5.1)
Sodium: 139 mmol/L (ref 135–145)
Total Bilirubin: 1.3 mg/dL — ABNORMAL HIGH (ref 0.3–1.2)
Total Protein: 8 g/dL (ref 6.5–8.1)

## 2017-04-17 LAB — URINALYSIS, COMPLETE (UACMP) WITH MICROSCOPIC
Bilirubin Urine: NEGATIVE
Glucose, UA: NEGATIVE mg/dL
Ketones, ur: 80 mg/dL — AB
Leukocytes, UA: NEGATIVE
NITRITE: NEGATIVE
Protein, ur: 100 mg/dL — AB
SPECIFIC GRAVITY, URINE: 1.035 — AB (ref 1.005–1.030)
pH: 5 (ref 5.0–8.0)

## 2017-04-17 LAB — PREGNANCY, URINE: Preg Test, Ur: NEGATIVE

## 2017-04-17 MED ORDER — SODIUM CHLORIDE 0.9 % IV BOLUS (SEPSIS)
1000.0000 mL | Freq: Once | INTRAVENOUS | Status: AC
  Administered 2017-04-17: 1000 mL via INTRAVENOUS

## 2017-04-17 MED ORDER — ONDANSETRON 4 MG PO TBDP: 4.0000 mg | ORAL_TABLET | Freq: Three times a day (TID) | ORAL | 0 refills | Status: DC | PRN

## 2017-04-17 MED ORDER — ONDANSETRON HCL 4 MG/2ML IJ SOLN
4.0000 mg | Freq: Once | INTRAMUSCULAR | Status: AC
  Administered 2017-04-17: 4 mg via INTRAVENOUS

## 2017-04-17 NOTE — ED Notes (Signed)
Pt states has had diarrhea and emesis since 1700 yesterday. Pt states has been having diarrhea 2-3 times per hour. Pt states "I feel dehydrated", pt with noted tachycardia with standing. Pt states history of c. Diff with antibiotic use 2 years ago. Pt states she is currently breastfeeding.

## 2017-04-17 NOTE — ED Notes (Signed)
Pt without emesis or diarrhea since arrival to room. Pt reports feeling improved.

## 2017-04-17 NOTE — ED Provider Notes (Signed)
[facility] Emergency Department Provider Note   First MD Initiated Contact with Patient 04/17/17 0010     (approximate)  I have reviewed the triage vital signs and the nursing notes.   HISTORY  Chief Complaint Emesis and Diarrhea    HPI Renee Reyes is a 32 y.o. female with history of Clostridium difficile colitis secondary to antibiotics 2 years ago presents to the emergency department with history of acute onset of vomiting and nonbloody diarrhea which began approximately 7 hours before presentation.  Patient states that she has had approximately 2-3 episodes of diarrhea per hour since onset.  Patient denies any sick contact.  Patient denies any abdominal pain.  Patient denies any fever afebrile on presentation.  Patient denies any recent antibiotic use.   Past Medical History:  Diagnosis Date  . C. difficile diarrhea   . Medical history non-contributory     Patient Active Problem List   Diagnosis Date Noted  . Unsuccessful IUD insertion 07/16/2016    Past Surgical History:  Procedure Laterality Date  . NO PAST SURGERIES      Prior to Admission medications   Medication Sig Start Date End Date Taking? Authorizing Provider  norethindrone (MICRONOR,CAMILA,ERRIN) 0.35 MG tablet Take 1 tablet (0.35 mg total) by mouth daily. 12/15/16   Philip Aspen, CNM  Prenatal Vit-Fe Fumarate-FA (PRENATAL MULTIVITAMIN) TABS tablet Take 1 tablet by mouth daily at 12 noon.    [provider]    Allergies Amoxicillin-pot clavulanate  Family History  Problem Relation Age of Onset  . Diabetes Father   . Hypertension Father   . Arthritis Maternal Grandmother     Social History Social History   Tobacco Use  . Smoking status: Never Smoker  . Smokeless tobacco: Never Used  Substance Use Topics  . Alcohol use: Yes    Comment: occ  . Drug use: No    Review of Systems Constitutional: No fever/chills Eyes: No visual changes. ENT: No sore  throat. Cardiovascular: Denies chest pain. Respiratory: Denies shortness of breath. Gastrointestinal: No abdominal pain.  Positive for vomiting and diarrhea  genitourinary: Negative for dysuria. Musculoskeletal: Negative for neck pain.  Negative for back pain. Integumentary: Negative for rash. Neurological: Negative for headaches, focal weakness or numbness.  ____________________________________________   PHYSICAL EXAM:  VITAL SIGNS: ED Triage Vitals  Enc Vitals Group     BP 04/16/17 2338 (!) 141/97     Pulse Rate 04/16/17 2338 (!) 138     Resp 04/16/17 2338 20     Temp 04/16/17 2344 98 F (36.7 C)     Temp Source 04/16/17 2344 Oral     SpO2 04/16/17 2338 100 %     Weight 04/16/17 2339 57.2 kg (126 lb)     Height 04/16/17 2339 1.6 m (5\' 3" )     Head Circumference --      Peak Flow --      Pain Score --      Pain Loc --      Pain Edu? --      Excl. in [place]? --     Constitutional: Alert and oriented. Well appearing and in no acute distress. Eyes: Conjunctivae are normal.  Head: Atraumatic. Mouth/Throat: Mucous membranes are dry. Oropharynx non-erythematous. Neck: No stridor.   Cardiovascular: Normal rate, regular rhythm. Good peripheral circulation. Grossly normal heart sounds. Respiratory: Normal respiratory effort.  No retractions. Lungs CTAB. Gastrointestinal: Soft and nontender. No distention.  Musculoskeletal: No lower extremity tenderness nor edema. No  gross deformities of extremities. Neurologic:  Normal speech and language. No gross focal neurologic deficits are appreciated.  Skin:  Skin is warm, dry and intact. No rash noted. Psychiatric: Mood and affect are normal. Speech and behavior are normal.  ____________________________________________   LABS (all labs ordered are listed, but only abnormal results are displayed)  Labs Reviewed  GASTROINTESTINAL PANEL BY PCR, STOOL (REPLACES STOOL CULTURE)  C DIFFICILE QUICK SCREEN W PCR REFLEX  CBC WITH  DIFFERENTIAL/PLATELET  COMPREHENSIVE METABOLIC PANEL  URINALYSIS, COMPLETE (UACMP) WITH MICROSCOPIC  PREGNANCY, URINE   ____________________________________________  EKG  ED ECG REPORT I, Herndon N Anupama Piehl, the attending physician, personally viewed and interpreted this ECG.   Date: 04/17/2017  EKG Time: 11:43 PM  Rate: 116  Rhythm: Sinus tachycardia with premature ventricular contraction  Axis: Normal  Intervals: Normal  ST&T Change: None    Procedures   ____________________________________________   INITIAL IMPRESSION / ASSESSMENT AND PLAN / ED COURSE  As part of my medical decision making, I reviewed the following data within the electronic MEDICAL RECORD [id number]-year-old female presented with history of physical consistent with infectious vomiting and diarrhea.  Patient had no further vomiting or diarrhea while in the emergency department.  Patient given IV normal saline 2 L Zofran in the emergency department.  Plan was to obtain a stool sample to identify the infectious etiology however patient had no further diarrhea. ____________________________________________  FINAL CLINICAL IMPRESSION(S) / ED DIAGNOSES  Final diagnoses:  Nausea vomiting and diarrhea     MEDICATIONS GIVEN DURING THIS VISIT:  Medications  sodium chloride 0.9 % bolus 1,000 mL (1,000 mLs Intravenous New Bag/Given 04/17/17 0015)  sodium chloride 0.9 % bolus 1,000 mL (1,000 mLs Intravenous New Bag/Given 04/17/17 0015)  ondansetron (ZOFRAN) injection 4 mg (4 mg Intravenous Given 04/17/17 0017)     ED Discharge Orders    None       Note:  This document was prepared using Dragon voice recognition software and may include unintentional dictation errors.    Gregor Hams, MD 04/17/17 [phone]

## 2017-04-17 NOTE — ED Notes (Signed)
Report to rebecca, rn.  

## 2017-06-29 ENCOUNTER — Ambulatory Visit (INDEPENDENT_AMBULATORY_CARE_PROVIDER_SITE_OTHER): Payer: BLUE CROSS/BLUE SHIELD | Admitting: Certified Nurse Midwife

## 2017-06-29 ENCOUNTER — Encounter: Payer: Self-pay | Admitting: Certified Nurse Midwife

## 2017-06-29 VITALS — BP 123/77 | HR 68 | Ht 63.0 in | Wt 126.5 lb

## 2017-06-29 DIAGNOSIS — Z789 Other specified health status: Secondary | ICD-10-CM | POA: Diagnosis not present

## 2017-06-29 DIAGNOSIS — Z30430 Encounter for insertion of intrauterine contraceptive device: Secondary | ICD-10-CM | POA: Diagnosis not present

## 2017-06-29 DIAGNOSIS — IMO0001 Reserved for inherently not codable concepts without codable children: Secondary | ICD-10-CM

## 2017-06-29 LAB — POCT URINE PREGNANCY: PREG TEST UR: NEGATIVE

## 2017-06-29 NOTE — Progress Notes (Signed)
Pt is here to discuss birth control. She is unsure of which method she would like.

## 2017-06-29 NOTE — Patient Instructions (Addendum)

## 2017-06-29 NOTE — Progress Notes (Signed)
  GYNECOLOGY OFFICE PROCEDURE NOTE  Renee Reyes is a 32 y.o. [id number] here for Mirena IUD insertion. No GYN concerns.  Last pap smear was on  11/2014 and was normal.  IUD Insertion Procedure Note Patient identified, informed consent performed, consent signed.   Discussed risks of irregular bleeding, cramping, infection, malpositioning or misplacement of the IUD outside the uterus which may require further procedure such as laparoscopy. Time out was performed.  Pt currently on period.   Speculum placed in the vagina.  Cervix visualized.  Cleaned with Betadine x 2.  Grasped anteriorly with a single tooth tenaculum.  Uterus sounded to 6cm.  Mirena IUD placed per manufacturer's recommendations.  Strings trimmed to 3 cm. Tenaculum was removed, good hemostasis noted.  Patient tolerated procedure well.   Patient was given post-procedure instructions.  She was advised to have backup contraception for one week.  Patient was also asked to check IUD strings periodically and follow up in 4 weeks for IUD check.   Philip Aspen, CNM

## 2017-07-27 ENCOUNTER — Ambulatory Visit: Payer: BLUE CROSS/BLUE SHIELD | Admitting: Certified Nurse Midwife

## 2017-07-27 VITALS — BP 122/75 | HR 70 | Ht 63.0 in | Wt 128.4 lb

## 2017-07-27 DIAGNOSIS — Z30431 Encounter for routine checking of intrauterine contraceptive device: Secondary | ICD-10-CM

## 2017-07-27 NOTE — Progress Notes (Signed)
Pt is here for an IUD string check. 

## 2017-07-27 NOTE — Patient Instructions (Signed)

## 2017-07-27 NOTE — Progress Notes (Signed)
GYNECOLOGY OFFICE PROGRESS NOTE  History:  32 y.o. [id number] here today for today for IUD string check; IUD was placed  06/29/17. No complaints about the IUD, no concerning side effects.  The following portions of the patient's history were reviewed and updated as appropriate: allergies, current medications, past family history, past medical history, past social history, past surgical history and problem list. Last pap smear on 11/2014 was normal.  Review of Systems:  Pertinent items are noted in HPI.   Objective:  Physical Exam Blood pressure 122/75, pulse 70, height 5\' 3"  (1.6 m), weight 128 lb 6 oz (58.2 kg), last menstrual period 07/17/2017, not currently breastfeeding. CONSTITUTIONAL: Well-developed, well-nourished female in no acute distress.  HENT:  Normocephalic, atraumatic. External right and left ear normal. Oropharynx is clear and moist EYES: Conjunctivae and EOM are normal. Pupils are equal, round, and reactive to light. No scleral icterus.  NECK: Normal range of motion, supple, no masses CARDIOVASCULAR: Normal heart rate noted RESPIRATORY: Effort and breath sounds normal, no problems with respiration noted ABDOMEN: Soft, no distention noted.   PELVIC: Normal appearing external genitalia; normal appearing vaginal mucosa and cervix.  IUD strings visualized, about 3 cm in length outside cervix. White tip noted with use of fox swab,  forceps and fox swab used to push IUD back up into uterus No bleeding, tip no longer visualized. Discussed with pt.   Assessment & Plan:  Normal IUD check. Patient to keep IUD in place for five years; can come in for removal if she desires pregnancy within the next five years. Red flag symptoms reviewed.  Routine preventative health maintenance measures emphasized.  Philip Aspen, CNM

## 2017-11-23 ENCOUNTER — Encounter: Payer: Self-pay | Admitting: Certified Nurse Midwife

## 2017-11-23 ENCOUNTER — Other Ambulatory Visit (HOSPITAL_COMMUNITY)
Admission: RE | Admit: 2017-11-23 | Discharge: 2017-11-23 | Disposition: A | Discharge status: Home / Self Care | Payer: BLUE CROSS/BLUE SHIELD | Source: Ambulatory Visit | Attending: Certified Nurse Midwife | Admitting: Certified Nurse Midwife

## 2017-11-23 ENCOUNTER — Ambulatory Visit (INDEPENDENT_AMBULATORY_CARE_PROVIDER_SITE_OTHER): Payer: BLUE CROSS/BLUE SHIELD | Admitting: Certified Nurse Midwife

## 2017-11-23 VITALS — BP 129/87 | HR 63 | Ht 63.0 in | Wt 132.4 lb

## 2017-11-23 DIAGNOSIS — Z01419 Encounter for gynecological examination (general) (routine) without abnormal findings: Secondary | ICD-10-CM | POA: Diagnosis not present

## 2017-11-23 DIAGNOSIS — Z124 Encounter for screening for malignant neoplasm of cervix: Secondary | ICD-10-CM | POA: Diagnosis present

## 2017-11-23 DIAGNOSIS — Z23 Encounter for immunization: Secondary | ICD-10-CM

## 2017-11-23 DIAGNOSIS — Z975 Presence of (intrauterine) contraceptive device: Secondary | ICD-10-CM | POA: Diagnosis not present

## 2017-11-23 DIAGNOSIS — Z1151 Encounter for screening for human papillomavirus (HPV): Secondary | ICD-10-CM | POA: Diagnosis not present

## 2017-11-23 NOTE — Patient Instructions (Signed)
Preventive Care 18-39 Years, Female Preventive care refers to lifestyle choices and visits with your health care provider that can promote health and wellness. What does preventive care include?  A yearly physical exam. This is also called an annual well check.  Dental exams once or twice a year.  Routine eye exams. Ask your health care provider how often you should have your eyes checked.  Personal lifestyle choices, including: ? Daily care of your teeth and gums. ? Regular physical activity. ? Eating a healthy diet. ? Avoiding tobacco and drug use. ? Limiting alcohol use. ? Practicing safe sex. ? Taking vitamin and mineral supplements as recommended by your health care provider. What happens during an annual well check? The services and screenings done by your health care provider during your annual well check will depend on your age, overall health, lifestyle risk factors, and family history of disease. Counseling Your health care provider may ask you questions about your:  Alcohol use.  Tobacco use.  Drug use.  Emotional well-being.  Home and relationship well-being.  Sexual activity.  Eating habits.  Work and work Statistician.  Method of birth control.  Menstrual cycle.  Pregnancy history.  Screening You may have the following tests or measurements:  Height, weight, and BMI.  Diabetes screening. This is done by checking your blood sugar (glucose) after you have not eaten for a while (fasting).  Blood pressure.  Lipid and cholesterol levels. These may be checked every 5 years starting at age 66.  Skin check.  Hepatitis C blood test.  Hepatitis B blood test.  Sexually transmitted disease (STD) testing.  BRCA-related cancer screening. This may be done if you have a family history of breast, ovarian, tubal, or peritoneal cancers.  Pelvic exam and Pap test. This may be done every 3 years starting at age 40. Starting at age 59, this may be done every 5  years if you have a Pap test in combination with an HPV test.  Discuss your test results, treatment options, and if necessary, the need for more tests with your health care provider. Vaccines Your health care provider may recommend certain vaccines, such as:  Influenza vaccine. This is recommended every year.  Tetanus, diphtheria, and acellular pertussis (Tdap, Td) vaccine. You may need a Td booster every 10 years.  Varicella vaccine. You may need this if you have not been vaccinated.  HPV vaccine. If you are 69 or younger, you may need three doses over 6 months.  Measles, mumps, and rubella (MMR) vaccine. You may need at least one dose of MMR. You may also need a second dose.  Pneumococcal 13-valent conjugate (PCV13) vaccine. You may need this if you have certain conditions and were not previously vaccinated.  Pneumococcal polysaccharide (PPSV23) vaccine. You may need one or two doses if you smoke cigarettes or if you have certain conditions.  Meningococcal vaccine. One dose is recommended if you are age 27-21 years and a first-year college student living in a residence hall, or if you have one of several medical conditions. You may also need additional booster doses.  Hepatitis A vaccine. You may need this if you have certain conditions or if you travel or work in places where you may be exposed to hepatitis A.  Hepatitis B vaccine. You may need this if you have certain conditions or if you travel or work in places where you may be exposed to hepatitis B.  Haemophilus influenzae type b (Hib) vaccine. You may need this if  you have certain risk factors.  Talk to your health care provider about which screenings and vaccines you need and how often you need them. This information is not intended to replace advice given to you by your health care provider. Make sure you discuss any questions you have with your health care provider. Document Released: 04/20/2001 Document Revised: 11/12/2015  Document Reviewed: 12/24/2014 Elsevier Interactive Patient Education  [employer].

## 2017-11-23 NOTE — Progress Notes (Signed)
GYNECOLOGY ANNUAL PREVENTATIVE CARE ENCOUNTER NOTE  Subjective:   Renee Reyes is a 32 y.o. [id number] female here for a routine annual gynecologic exam.  Current complaints: none.   Denies abnormal vaginal bleeding, discharge, pelvic pain, problems with intercourse or other gynecologic concerns.    Gynecologic History No LMP recorded. (Menstrual status: IUD). Contraception: IUD Last Pap: 11/2014. Results were: normal Last mammogram: N/A.   Obstetric History OB History  Gravida Para Term Preterm AB Living  2 2 2     2   SAB TAB Ectopic Multiple Live Births        0 2    # Outcome Date GA Lbr Len/2nd Weight Sex Delivery Anes PTL Lv  2 Term 05/18/16 [redacted]w[redacted]d / 01:11 5 lb 15.6 oz (2.71 kg) F Vag-Spont EPI  LIV  1 Term 05/21/14   6.3 oz (0.179 kg) M Vag-Spont  N LIV    Past Medical History:  Diagnosis Date  . C. difficile diarrhea   . Medical history non-contributory     Past Surgical History:  Procedure Laterality Date  . NO PAST SURGERIES      Current Outpatient Medications on File Prior to Visit  Medication Sig Dispense Refill  . levonorgestrel (MIRENA) 20 MCG/24HR IUD 1 each by Intrauterine route once.     No current facility-administered medications on file prior to visit.     Allergies  Allergen Reactions  . Amoxicillin-Pot Clavulanate Diarrhea, Nausea And Vomiting and Other (See Comments)    Social History:  reports that she has never smoked. She has never used smokeless tobacco. She reports that she drinks alcohol. She reports that she does not use drugs.  Exercise 2-3 days a wk for 30-45 min.  Family History  Problem Relation Age of Onset  . Diabetes Father   . Hypertension Father   . Arthritis Maternal Grandmother     The following portions of the patient's history were reviewed and updated as appropriate: allergies, current medications, past family history, past medical history, past social history, past surgical history and problem list.  Review of  Systems Pertinent items noted in HPI and remainder of comprehensive ROS otherwise negative.   Objective:  There were no vitals taken for this visit. CONSTITUTIONAL: Well-developed, well-nourished female in no acute distress.  HENT:  Normocephalic, atraumatic, External right and left ear normal. Oropharynx is clear and moist EYES: Conjunctivae and EOM are normal. Pupils are equal, round, and reactive to light. No scleral icterus.  NECK: Normal range of motion, supple, no masses.  Normal thyroid.  SKIN: Skin is warm and dry. No rash noted. Not diaphoretic. No erythema. No pallor. MUSCULOSKELETAL: Normal range of motion. No tenderness.  No cyanosis, clubbing, or edema.  2+ distal pulses. NEUROLOGIC: Alert and oriented to person, place, and time. Normal reflexes, muscle tone coordination. No cranial nerve deficit noted. PSYCHIATRIC: Normal mood and affect. Normal behavior. Normal judgment and thought content. CARDIOVASCULAR: Normal heart rate noted, regular rhythm RESPIRATORY: Clear to auscultation bilaterally. Effort and breath sounds normal, no problems with respiration noted. BREASTS: Symmetric in size. No masses, skin changes, nipple drainage, or lymphadenopathy. ABDOMEN: Soft, normal bowel sounds, no distention noted.  No tenderness, rebound or guarding.  PELVIC: Normal appearing external genitalia; normal appearing vaginal mucosa and cervix.  No abnormal discharge noted. Small blood present with period.  Pap smear obtained. IUD strings present.  Normal uterine size, no other palpable masses, no uterine or adnexal tenderness.    Assessment and Plan:  Annual Well Women  Exam . Will follow up results of pap smear and manage accordingly. Mammogram not indicated Labs: Lipid profile Vaccine: flu shot today  Routine preventative health maintenance measures emphasized. Please refer to After Visit Summary for other counseling recommendations.   Philip Aspen, CNM

## 2017-11-24 LAB — LIPID PANEL
Chol/HDL Ratio: 2.4 ratio (ref 0.0–4.4)
Cholesterol, Total: 163 mg/dL (ref 100–199)
HDL: 67 mg/dL (ref >39)
LDL CALC: 80 mg/dL (ref 0–99)
Triglycerides: 78 mg/dL (ref 0–149)
VLDL CHOLESTEROL CAL: 16 mg/dL (ref 5–40)

## 2017-11-28 LAB — CYTOLOGY - PAP
Diagnosis: NEGATIVE
HPV: NOT DETECTED

## 2018-02-21 ENCOUNTER — Encounter: Payer: BLUE CROSS/BLUE SHIELD | Admitting: Certified Nurse Midwife

## 2018-05-13 ENCOUNTER — Emergency Department: Payer: BLUE CROSS/BLUE SHIELD

## 2018-05-13 ENCOUNTER — Emergency Department
Admission: EM | Admit: 2018-05-13 | Discharge: 2018-05-13 | Disposition: A | Discharge status: Home / Self Care | Payer: BLUE CROSS/BLUE SHIELD | Attending: Emergency Medicine | Admitting: Emergency Medicine

## 2018-05-13 ENCOUNTER — Encounter: Payer: Self-pay | Admitting: Emergency Medicine

## 2018-05-13 DIAGNOSIS — R29810 Facial weakness: Secondary | ICD-10-CM | POA: Diagnosis present

## 2018-05-13 DIAGNOSIS — Z79899 Other long term (current) drug therapy: Secondary | ICD-10-CM | POA: Diagnosis not present

## 2018-05-13 DIAGNOSIS — G510 Bell's palsy: Secondary | ICD-10-CM | POA: Insufficient documentation

## 2018-05-13 LAB — DIFFERENTIAL
ABS IMMATURE GRANULOCYTES: 0.04 10*3/uL (ref 0.00–0.07)
BASOS PCT: 1 %
Basophils Absolute: 0.1 10*3/uL (ref 0.0–0.1)
EOS ABS: 0.1 10*3/uL (ref 0.0–0.5)
Eosinophils Relative: 1 %
IMMATURE GRANULOCYTES: 0 %
Lymphocytes Relative: 20 %
Lymphs Abs: 2.2 10*3/uL (ref 0.7–4.0)
MONOS PCT: 6 %
Monocytes Absolute: 0.7 10*3/uL (ref 0.1–1.0)
Neutro Abs: 8.2 10*3/uL — ABNORMAL HIGH (ref 1.7–7.7)
Neutrophils Relative %: 72 %

## 2018-05-13 LAB — COMPREHENSIVE METABOLIC PANEL
ALT: 20 U/L (ref 0–44)
AST: 25 U/L (ref 15–41)
Albumin: 4.1 g/dL (ref 3.5–5.0)
Alkaline Phosphatase: 38 U/L (ref 38–126)
Anion gap: 8 (ref 5–15)
BUN: 21 mg/dL — AB (ref 6–20)
CO2: 23 mmol/L (ref 22–32)
Calcium: 9.9 mg/dL (ref 8.9–10.3)
Chloride: 105 mmol/L (ref 98–111)
Creatinine, Ser: 0.56 mg/dL (ref 0.44–1.00)
GFR calc Af Amer: >60 mL/min (ref >60)
Glucose, Bld: 96 mg/dL (ref 70–99)
POTASSIUM: 3.9 mmol/L (ref 3.5–5.1)
SODIUM: 136 mmol/L (ref 135–145)
Total Bilirubin: 1 mg/dL (ref 0.3–1.2)
Total Protein: 7.3 g/dL (ref 6.5–8.1)

## 2018-05-13 LAB — CBC
HCT: 40.5 % (ref 36.0–46.0)
Hemoglobin: 13.2 g/dL (ref 12.0–15.0)
MCH: 29.7 pg (ref 26.0–34.0)
MCHC: 32.6 g/dL (ref 30.0–36.0)
MCV: 91 fL (ref 80.0–100.0)
PLATELETS: 258 10*3/uL (ref 150–400)
RBC: 4.45 MIL/uL (ref 3.87–5.11)
RDW: 13 % (ref 11.5–15.5)
WBC: 11.3 10*3/uL — ABNORMAL HIGH (ref 4.0–10.5)
nRBC: 0 % (ref 0.0–0.2)

## 2018-05-13 LAB — APTT: APTT: 33 s (ref 24–36)

## 2018-05-13 LAB — PROTIME-INR
INR: 1.1 (ref 0.8–1.2)
Prothrombin Time: 13.8 seconds (ref 11.4–15.2)

## 2018-05-13 MED ORDER — VALACYCLOVIR HCL 1 G PO TABS: 1000.0000 mg | ORAL_TABLET | Freq: Three times a day (TID) | ORAL | 0 refills | Status: DC

## 2018-05-13 MED ORDER — PREDNISONE 10 MG PO TABS: 10.0000 mg | ORAL_TABLET | Freq: Every day | ORAL | 0 refills | Status: DC

## 2018-05-13 NOTE — ED Triage Notes (Signed)
Patient presents to the ED with left sided facial droop and facial asymmetry.  Patient's right eye seems to be more open than left eye.  Patient is able to raise both her eye brows.  Patient denies weakness in arms and legs.  Denies dizziness and headache.  Patient states, "a lot of the issues are with the left eye."  Patient is in no obvious distress at this time.  Speech is clear.  Patient states symptoms started when she woke up yesterday.

## 2018-05-13 NOTE — ED Provider Notes (Signed)
[facility] Emergency Department Provider Note  ____________________________________________  Time seen: Approximately 4:23 PM  I have reviewed the triage vital signs and the nursing notes.   HISTORY  Chief Complaint Facial Droop    HPI Renee Reyes is a 33 y.o. female who presents the emergency department complaining of left-sided facial droop, decreased sensation to the left side of the face.  Per the patient, she woke up yesterday, after she noticed the symptoms was when she went to brush her teeth and had drooling on the left side of her mouth.  Patient then looked in the mirror and noticed left-sided facial droop.  Since then, patient has reported a slight worsening in the droop with no improvement.  She is concerned as there has been no improvement.  She denies any recent trauma.  No headache associated with symptoms.  No numbness and tingling in the upper or lower extremity.  No decreased motor function on the left side or right.  Patient has had no difficulty formulating words.  No recent illnesses.  No chronic medical problems.  No chronic medications.  No medications for this complaint.    Past Medical History:  Diagnosis Date  . C. difficile diarrhea   . Medical history non-contributory     There are no active problems to display for this patient.   Past Surgical History:  Procedure Laterality Date  . NO PAST SURGERIES      Prior to Admission medications   Medication Sig Start Date End Date Taking? Authorizing Provider  levonorgestrel (MIRENA) 20 MCG/24HR IUD 1 each by Intrauterine route once.    [provider]  predniSONE (DELTASONE) 10 MG tablet Take 1 tablet (10 mg total) by mouth daily. 05/13/18   Preslyn Warr, Charline Bills, PA-C  valACYclovir (VALTREX) 1000 MG tablet Take 1 tablet (1,000 mg total) by mouth 3 (three) times daily. 05/13/18   Tani Virgo, Charline Bills, PA-C    Allergies Amoxicillin-pot clavulanate  Family History   Problem Relation Age of Onset  . Diabetes Father   . Hypertension Father   . Arthritis Maternal Grandmother     Social History Social History   Tobacco Use  . Smoking status: Never Smoker  . Smokeless tobacco: Never Used  Substance Use Topics  . Alcohol use: Yes    Comment: occ  . Drug use: No     Review of Systems  Constitutional: No fever/chills Eyes: No visual changes. No discharge ENT: No upper respiratory complaints. Cardiovascular: no chest pain. Respiratory: no cough. No SOB. Gastrointestinal: No abdominal pain.  No nausea, no vomiting.  No diarrhea.  No constipation. Musculoskeletal: Negative for musculoskeletal pain. Skin: Negative for rash, abrasions, lacerations, ecchymosis. Neurological: Negative for headaches.  Positive for left-sided facial droop. 10-point ROS otherwise negative.  ____________________________________________   PHYSICAL EXAM:  VITAL SIGNS: ED Triage Vitals  Enc Vitals Group     BP 05/13/18 1340 134/85     Pulse Rate 05/13/18 1340 78     Resp 05/13/18 1340 18     Temp 05/13/18 1340 98.2 F (36.8 C)     Temp Source 05/13/18 1340 Oral     SpO2 05/13/18 1340 100 %     Weight 05/13/18 1341 127 lb (57.6 kg)     Height 05/13/18 1341 5\' 3"  (1.6 m)     Head Circumference --      Peak Flow --      Pain Score 05/13/18 1341 0     Pain Loc --  Pain Edu? --      Excl. in [place]? --      Constitutional: Alert and oriented. Well appearing and in no acute distress. Eyes: Conjunctivae are normal. PERRL. EOMI. Head: Visualization of the face reveals mild left-sided facial droop.  Patient is able to raise eyebrows symmetrically however.  Mild decrease in sensation left side when compared with right.  Patient is able to close both eyes.  Mild reduction of nasolabial fold on the left when compared with right.  No visible skin findings or lesions noted.  No palpable cord in the left temporal region.  No tenderness to palpation in the left temporal  region. ENT:      Ears:       Nose: No congestion/rhinnorhea.      Mouth/Throat: Mucous membranes are moist.  Neck: No stridor.    Cardiovascular: Normal rate, regular rhythm. Normal S1 and S2.  Good peripheral circulation. Respiratory: Normal respiratory effort without tachypnea or retractions. Lungs CTAB. Good air entry to the bases with no decreased or absent breath sounds. Musculoskeletal: Full range of motion to all extremities. No gross deformities appreciated. Neurologic:  Normal speech and language. No gross focal neurologic deficits are appreciated.  Cranial nerve testing reveals deficits to the left-sided facial nerve with the exception of bilateral eyebrow raise.  Otherwise, cranial nerve testing is reassuring with no other significant acute deficits.  Negative Romberg's and pronator drift Skin:  Skin is warm, dry and intact. No rash noted. Psychiatric: Mood and affect are normal. Speech and behavior are normal. Patient exhibits appropriate insight and judgement.   ____________________________________________   LABS (all labs ordered are listed, but only abnormal results are displayed)  Labs Reviewed  CBC - Abnormal; Notable for the following components:      Result Value   WBC 11.3 (*)    All other components within normal limits  DIFFERENTIAL - Abnormal; Notable for the following components:   Neutro Abs 8.2 (*)    All other components within normal limits  COMPREHENSIVE METABOLIC PANEL - Abnormal; Notable for the following components:   BUN 21 (*)    All other components within normal limits  PROTIME-INR  APTT  CBG MONITORING, ED  POC URINE PREG, ED   ____________________________________________  EKG   ____________________________________________  RADIOLOGY I personally viewed and evaluated these images as part of my medical decision making, as well as reviewing the written report by the radiologist.  Ct Head Wo Contrast  Result Date: 05/13/2018 CLINICAL  DATA:  Left-sided facial droop since yesterday morning. EXAM: CT HEAD WITHOUT CONTRAST TECHNIQUE: Contiguous axial images were obtained from the base of the skull through the vertex without intravenous contrast. COMPARISON:  None. FINDINGS: Brain: No evidence of acute infarction, hemorrhage, hydrocephalus, extra-axial collection or mass lesion/mass effect. Vascular: No hyperdense vessel or unexpected calcification. Skull: Normal. Negative for fracture or focal lesion. Sinuses/Orbits: No acute finding. Other: None. IMPRESSION: 1. Normal noncontrast head CT. Electronically Signed   By: Titus Dubin M.D.   On: 05/13/2018 14:32   Mr Brain Wo Contrast  Result Date: 05/13/2018 CLINICAL DATA:  Left-sided facial droop and facial asymmetry. Facial nerve paralysis. EXAM: MRI HEAD WITHOUT CONTRAST TECHNIQUE: Multiplanar, multiecho pulse sequences of the brain and surrounding structures were obtained without intravenous contrast. COMPARISON:  None. FINDINGS: Brain: The diffusion-weighted images demonstrate no acute or subacute infarction. No hemorrhage or mass lesion is present. Vascular: Flow is present in the major intracranial arteries. Skull and upper cervical spine: The craniocervical junction  is normal. Upper cervical spine is within normal limits. Marrow signal is unremarkable. Sinuses/Orbits: The paranasal sinuses and mastoid air cells are clear. The globes and orbits are within normal limits. IMPRESSION: Negative MRI the brain. No acute or focal lesion to explain facial droop or asymmetry. Electronically Signed   By: San Morelle M.D.   On: 05/13/2018 18:17    ____________________________________________    PROCEDURES  Procedure(s) performed:    Procedures    Medications - No data to display   ____________________________________________   INITIAL IMPRESSION / ASSESSMENT AND PLAN / ED COURSE  Pertinent labs & imaging results that were available during my care of the patient were  reviewed by me and considered in my medical decision making (see chart for details).  Review of the [place] CSRS was performed in accordance of the [place] prior to dispensing any controlled drugs.  Clinical Course as of May 12 1900  Sat May 13, 2018  1635 Patient presented to the emergency department with a complaint of left-sided facial droop starting yesterday.  Patient does have findings consistent with facial nerve paralysis.  Patient does have sparing of the forehead however.  I suspect that diagnosis is most likely Bell's palsy, however with the sparing of the forehead with other appreciable symptoms, MRI will be performed for upper motor neuron lesion.  Labs, CT scan at this time are reassuring.  Pending MRI at this time.   [JC]    Clinical Course User Index [JC] Emery Binz, Charline Bills, PA-C     Patient's diagnosis is consistent with Bell's palsy.  Patient presented to the emergency department with facial nerve paralysis.  Patient has a House-Brackmann score of 2.  Given sparing of the forehead patient was evaluated with MRI for evaluation of any brain lesion.  MRI returns with reassuring results.  Diagnosis is most consistent with Bell's palsy.  Patient will be started on antiviral and steroid therapy.  Follow-up with primary care..  Patient is given ED precautions to return to the ED for any worsening or new symptoms.     ____________________________________________  FINAL CLINICAL IMPRESSION(S) / ED DIAGNOSES  Final diagnoses:  Bell's palsy      NEW MEDICATIONS STARTED DURING THIS VISIT:  ED Discharge Orders         Ordered    valACYclovir (VALTREX) 1000 MG tablet  3 times daily     05/13/18 1900    predniSONE (DELTASONE) 10 MG tablet  Daily    Note to Pharmacy:  Take 60 mg daily for 1 week.  Then take taper of 6, 5, 4, 3, 2, 1.   05/13/18 1900              This chart was dictated using voice recognition software/Dragon. Despite best efforts to proofread, errors can  occur which can change the meaning. Any change was purely unintentional.    Darletta Moll, PA-C 05/13/18 Beacher May, MD 05/13/18 2009

## 2018-05-13 NOTE — ED Notes (Signed)
.  No peripheral IV placed this visit.    Discharge instructions reviewed with patient. Questions fielded by this RN. Patient verbalizes understanding of instructions. Patient discharged home in stable condition per Jonathon. No acute distress noted at time of discharge.

## 2018-05-13 NOTE — ED Notes (Signed)
Reference triage note. Pt with steady gait to ED stretcher. NAD noted at this time. Pt denies any extremity weakness at this time.

## 2018-06-06 ENCOUNTER — Ambulatory Visit: Payer: Self-pay | Admitting: Allergy & Immunology

## 2018-07-04 ENCOUNTER — Encounter: Payer: Self-pay | Admitting: Allergy & Immunology

## 2018-07-04 ENCOUNTER — Other Ambulatory Visit: Payer: Self-pay

## 2018-07-04 ENCOUNTER — Ambulatory Visit: Payer: BLUE CROSS/BLUE SHIELD | Admitting: Allergy & Immunology

## 2018-07-04 VITALS — BP 114/82 | HR 68 | Temp 98.2°F | Resp 16 | Ht 62.6 in | Wt 133.0 lb

## 2018-07-04 DIAGNOSIS — L508 Other urticaria: Secondary | ICD-10-CM

## 2018-07-04 DIAGNOSIS — J310 Chronic rhinitis: Secondary | ICD-10-CM | POA: Diagnosis not present

## 2018-07-04 MED ORDER — CETIRIZINE HCL 10 MG PO TABS: 10.0000 mg | ORAL_TABLET | Freq: Every day | ORAL | 5 refills | Status: AC

## 2018-07-04 MED ORDER — FAMOTIDINE 20 MG PO TABS: 20.0000 mg | ORAL_TABLET | ORAL | 5 refills | Status: AC | PRN

## 2018-07-04 NOTE — Patient Instructions (Addendum)
1. Chronic rhinitis - Testing today showed: negative to the entire panel - Copy of test results provided. - We will get blood work to confirm this.   - We will call you in 1-2 weeks with the results of the testing.  - Start taking: Zyrtec (cetirizine) 10mg  tablet twice daily - You can use an extra dose of the antihistamine, if needed, for breakthrough symptoms.  - Consider nasal saline rinses 1-2 times daily to remove allergens from the nasal cavities as well as help with mucous clearance (this is especially helpful to do before the nasal sprays are given)  2. Chronic urticaria - Your history does not have any "red flags" such as fevers, joint pains, or permanent skin changes that would be concerning for a more serious cause of hives.  - Testing to the most common food was negative (peanut, sesame, cashew, soy, fish mix, shellfish mix, wheat, milk, casein, egg) - There is a the low positive predictive value of food allergy testing and hence the high possibility of false positives. - In contrast, food allergy testing has a high negative predictive value, therefore if testing is negative we can be relatively assured that they are indeed negative.  - We will get some labs to rule out serious causes of hives: complete blood count, tryptase level, chronic urticaria panel, alpha gal panel, ANA, CMP. - Chronic hives are often times a self limited process and will "burn themselves out" over 6-12 months, although this is not always the case.  - In the meantime, start suppressive dosing of antihistamines:   - Morning: Zyrtec (cetirizine) 10mg   - Evening: Zyrtec (cetirizine) 10mg   - If the above is not working, try adding: Pepcid (famotidine) 20mg  - You can change this dosing at home, decreasing the dose as needed or increasing the dosing as needed.   3. No follow-ups on file. This can be an in-person, a virtual Webex or a telephone follow up visit.   Please inform us of any Emergency Department  visits, hospitalizations, or changes in symptoms. Call us before going to the ED for breathing or allergy symptoms since we might be able to fit you in for a sick visit. Feel free to contact us anytime with any questions, problems, or concerns.  It was a pleasure to meet you today!  Websites that have reliable patient information: 1. American Academy of Asthma, Allergy, and Immunology: www.aaaai.org 2. Food Allergy Research and Education (FARE): foodallergy.org 3. Mothers of Asthmatics: http://www.asthmacommunitynetwork.org 4. American College of Allergy, Asthma, and Immunology: www.acaai.org  "Like" [place] on Facebook and Instagram for our latest updates!      Make sure you are registered to vote! If you have moved or changed any of your contact information, you will need to get this updated before voting!    Voter ID laws are NOT going into effect for the General Election in November 2020! DO NOT let this stop you from exercising your right to vote!

## 2018-07-04 NOTE — Progress Notes (Signed)
NEW PATIENT  Date of Service/Encounter:  07/04/18  Referring provider: Idelle Crouch, MD   Assessment:   Chronic rhinitis - ? local allergic rhinitis  Chronic urticaria    Renee Reyes presents for an evaluation of what appear to be urticaria according to the pictures she shows me.  They have been ongoing for period of 1 year.  What is odd is that they only occur when she is outdoors, even in the wintertime.  Testing today is notable for a negative skin test.  Testing to the most common foods was completely negative, ruling out 96 to 97% of all food allergens.  I think we can work on controlling her symptoms with a combination of suppressive antihistamine doses and avoidance measures.  We are going to get some lab work to rule out serious causes of hives as well. Fortunately, there are no red flags and no history of autoimmunity in her family aside from some hyperpigmentation which she thinks might be related to itching.    Plan/Recommendations:   1. Chronic rhinitis - Testing today showed: negative to the entire panel - Copy of test results provided. - We will get blood work to confirm this.   - We will call you in 1-2 weeks with the results of the testing.  - Start taking: Zyrtec (cetirizine) 10mg  tablet twice daily - You can use an extra dose of the antihistamine, if needed, for breakthrough symptoms.  - Consider nasal saline rinses 1-2 times daily to remove allergens from the nasal cavities as well as help with mucous clearance (this is especially helpful to do before the nasal sprays are given)  2. Chronic urticaria - Your history does not have any "red flags" such as fevers, joint pains, or permanent skin changes that would be concerning for a more serious cause of hives.  - Testing to the most common food was negative (peanut, sesame, cashew, soy, fish mix, shellfish mix, wheat, milk, casein, egg) - There is a the low positive predictive value of food allergy testing and  hence the high possibility of false positives. - In contrast, food allergy testing has a high negative predictive value, therefore if testing is negative we can be relatively assured that they are indeed negative.  - We will get some labs to rule out serious causes of hives: complete blood count, tryptase level, chronic urticaria panel, alpha gal panel, ANA, CMP. - Chronic hives are often times a self limited process and will "burn themselves out" over 6-12 months, although this is not always the case.  - In the meantime, start suppressive dosing of antihistamines:   - Morning: Zyrtec (cetirizine) 10mg   - Evening: Zyrtec (cetirizine) 10mg   - If the above is not working, try adding: Pepcid (famotidine) 20mg  - You can change this dosing at home, decreasing the dose as needed or increasing the dosing as needed.   3. Follow up in . This can be an in-person, a virtual Webex or a telephone follow up visit.   Subjective:   Renee Reyes is a 33 y.o. female presenting today for evaluation of  Chief Complaint  Patient presents with  . Allergic Reaction    spending more then 30 minutes outside pt will breakout     Renee Reyes has a history of the following: There are no active problems to display for this patient.   History obtained from: chart review and patient.  Renee Reyes was referred by Idelle Crouch, MD.  Renee Reyes is a 33 y.o. female presenting for an evaluation of possible hives.  She reports that she develops itching and "allergic reactions" around 30-45 minutes after getting outside. She says that she feels that they are bites. Symptoms continue for upwards of a couple of days. She shows me picture that are consistent with urticaria. She reports that they have gotten so large and can leave dark spots after resolution. She denies any fevers. She denies joint pain with these. Typically they do not leave residual pigmentation changes.   If she stays inside for several  days in a row or is outside for a short time, things are fine. These even occurs in the middle of the winter, although she is not outside much in the winter. She estimates that this started one year ago. She does not think that this is food allergy related but she is overall unsure. She does not eat red meat often at all, she estimates once every two weeks at the most.     Allergic Rhinitis Symptom History: She does have AR symptoms include itchy watery eyes and runny nose. She does use some OTC pills occasionally, otherwise no treatment. She has never been allergy tested. She is from [place] nad moved up here around 6 years. Symptoms have not changed much over the years since moving here. She does not get antibiotics ofr sinus infections.   Otherwise, there is no history of other atopic diseases, including asthma, food allergies, drug allergies, stinging insect allergies, eczema or contact dermatitis. There is no significant infectious history. Vaccinations are up to date.    Past Medical History: There are no active problems to display for this patient.   Medication List:  Allergies as of 07/04/2018      Reactions   Amoxicillin-pot Clavulanate Diarrhea, Nausea And Vomiting, Other (See Comments)      Medication List       Accurate as of July 04, 2018 11:45 AM. Always use your most recent med list.        cetirizine 10 MG tablet Commonly known as:  ZYRTEC Take 10 mg by mouth as needed for allergies.   cetirizine 10 MG tablet Commonly known as:  ZYRTEC Take 1 tablet (10 mg total) by mouth daily.   famotidine 20 MG tablet Commonly known as:  Pepcid Take 1 tablet (20 mg total) by mouth as needed for heartburn or indigestion.   levonorgestrel 20 MCG/24HR IUD Commonly known as:  MIRENA 1 each by Intrauterine route once.       Birth History: non-contributory  Developmental History: non-controibutoy  Past Surgical History: Past Surgical History:  Procedure Laterality  Date  . NO PAST SURGERIES       Family History: Family History  Problem Relation Age of Onset  . Diabetes Father   . Hypertension Father   . Arthritis Maternal Grandmother   . Asthma Maternal Grandmother   . Allergic rhinitis Neg Hx   . Angioedema Neg Hx   . Eczema Neg Hx   . Immunodeficiency Neg Hx      Social History: Jose lives at home with her family. She works as an Chief Financial Officer. She works at [employer]. They are still working. 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 There are some coronavirus cases therefore. Thankfully, she gets to work from home.    Review of Systems  Constitutional: Negative.  Negative for fever, malaise/fatigue and weight loss.  HENT: Negative.  Negative for congestion, ear discharge, ear pain and sore throat.  Eyes: Negative for pain, discharge and redness.  Respiratory: Negative for cough, sputum production, shortness of breath and wheezing.   Cardiovascular: Negative.  Negative for chest pain and palpitations.  Gastrointestinal: Negative for abdominal pain, heartburn and nausea.  Skin: Positive for itching and rash.  Neurological: Negative for dizziness and headaches.  Endo/Heme/Allergies: Positive for environmental allergies. Does not bruise/bleed easily.       Objective:   Blood pressure 114/82, pulse 68, temperature 98.2 F (36.8 C), temperature source Oral, resp. rate 16, height 5' 2.6" (1.59 m), weight 133 lb (60.3 kg), SpO2 98 %. Body mass index is 23.86 kg/m.   Physical Exam:   Physical Exam  Constitutional: She appears well-developed.  Pleasant female. Talkative.   HENT:  Head: Normocephalic and atraumatic.  Right Ear: Tympanic membrane, external ear and ear canal normal. No drainage, swelling or tenderness. Tympanic membrane is not injected, not scarred, not erythematous, not retracted and not bulging.  Left Ear: Tympanic membrane, external ear and ear canal normal. No drainage, swelling or tenderness. Tympanic membrane is  not injected, not scarred, not erythematous, not retracted and not bulging.  Nose: Mucosal edema and rhinorrhea present. No nasal deformity or septal deviation. No epistaxis. Right sinus exhibits no maxillary sinus tenderness and no frontal sinus tenderness. Left sinus exhibits no maxillary sinus tenderness and no frontal sinus tenderness.  Mouth/Throat: Uvula is midline and oropharynx is clear and moist. Mucous membranes are not pale and not dry.  Tonsils 2+ bilaterally. No exudates present.  Eyes: Pupils are equal, round, and reactive to light. Conjunctivae and EOM are normal. Right eye exhibits no chemosis and no discharge. Left eye exhibits no chemosis and no discharge. Right conjunctiva is not injected. Left conjunctiva is not injected.  Allergic shiners bilaterally.   Cardiovascular: Normal rate, regular rhythm and normal heart sounds.  Respiratory: Effort normal and breath sounds normal. No accessory muscle usage. No tachypnea. No respiratory distress. She has no wheezes. She has no rhonchi. She has no rales. She exhibits no tenderness.  Moving air well in all lung fields.  GI: There is no abdominal tenderness. There is no rebound and no guarding.  Lymphadenopathy:       Head (right side): No submandibular, no tonsillar and no occipital adenopathy present.       Head (left side): No submandibular, no tonsillar and no occipital adenopathy present.    She has no cervical adenopathy.  Neurological: She is alert.  Skin: No abrasion, no petechiae and no rash noted. Rash is not papular, not vesicular and not urticarial. No erythema. No pallor.  Tattoo on the left lower extensor surface of the arm. There are  Psychiatric: She has a normal mood and affect.     Diagnostic studies:      Allergy Studies:    Airborne Adult Perc - 07/04/18 0951    Time Antigen Placed  0945    Allergen Manufacturer  Lavella Hammock    Location  Back    Number of Test  59    Panel 1  Select    1. Control-Buffer 50%  Glycerol  Negative    2. Control-Histamine 1 mg/ml  3+    3. Albumin saline  Negative    4. Elgin  Negative    5. [place]  Negative    6. Johnson  Negative    7. Twin Oaks Blue  Negative    8. Meadow Fescue  Negative    9. Perennial Rye  Negative    10. Sweet  Vernal  Negative    11. Timothy  Negative    12. Cocklebur  Negative    13. Burweed Marshelder  Negative    14. Ragweed, short  Negative    15. Ragweed, Giant  Negative    16. Plantain,  English  Negative    17. Lamb's Quarters  Negative    18. Sheep Sorrell  Negative    19. Rough Pigweed  Negative    20. Marsh Elder, Rough  Negative    21. Mugwort, Common  Negative    22. Ash mix  Negative    23. Birch mix  Negative    24. Beech American  Negative    25. Box, Elder  Negative    26. Cedar, red  Negative    27. Cottonwood, [place]  Negative    28. Elm mix  Negative    29. Hickory mix  Negative    30. Maple mix  Negative    31. Oak, [place] mix  Negative    32. Pecan Pollen  Negative    33. Pine mix  Negative    34. Sycamore Eastern  Negative    35. [place], Black Pollen  Negative    36. Alternaria alternata  Negative    37. Cladosporium Herbarum  Negative    38. Aspergillus mix  Negative    39. Penicillium mix  Negative    40. Bipolaris sorokiniana (Helminthosporium)  Negative    41. Drechslera spicifera (Curvularia)  Negative    42. Mucor plumbeus  Negative    43. Fusarium moniliforme  Negative    44. Aureobasidium pullulans (pullulara)  Negative    45. Rhizopus oryzae  Negative    46. Botrytis cinera  Negative    47. Epicoccum nigrum  Negative    48. Phoma betae  Negative    49. Candida Albicans  Negative    50. Trichophyton mentagrophytes  Negative    51. Mite, D Farinae  5,000 AU/ml  Negative    52. Mite, D Pteronyssinus  5,000 AU/ml  Negative    53. Cat Hair 10,000 BAU/ml  Negative    54.  Dog Epithelia  Negative    55. Mixed Feathers  Negative    56. Horse Epithelia  Negative    57. Cockroach, German   Negative    58. Mouse  Negative    59. Tobacco Leaf  Negative     Food Perc - 07/04/18 0951    Time Antigen Placed  0945    Allergen Manufacturer  Lavella Hammock    Location  Back    Number of allergen test  10    Food  Select    1. Peanut  Negative    2. Soybean food  Negative    3. Wheat, whole  Negative    4. Sesame  Negative    5. Milk, cow  Negative    6. Egg White, chicken  Negative    7. Casein  Negative    8. Shellfish mix  Negative    9. Fish mix  Negative    10. Cashew  Negative       Allergy testing results were read and interpreted by myself, documented by clinical staff.         Salvatore Marvel, MD Allergy and [place] [facility] [place]

## 2018-07-12 LAB — CBC WITH DIFFERENTIAL/PLATELET
Basophils Absolute: 0.1 10*3/uL (ref 0.0–0.2)
Basos: 1 %
EOS (ABSOLUTE): 0.1 10*3/uL (ref 0.0–0.4)
Eos: 2 %
Hematocrit: 39.6 % (ref 34.0–46.6)
Hemoglobin: 13.5 g/dL (ref 11.1–15.9)
Immature Grans (Abs): 0 10*3/uL (ref 0.0–0.1)
Immature Granulocytes: 0 %
Lymphocytes Absolute: 1.8 10*3/uL (ref 0.7–3.1)
Lymphs: 27 %
MCH: 30.6 pg (ref 26.6–33.0)
MCHC: 34.1 g/dL (ref 31.5–35.7)
MCV: 90 fL (ref 79–97)
Monocytes Absolute: 0.6 10*3/uL (ref 0.1–0.9)
Monocytes: 9 %
Neutrophils Absolute: 4.2 10*3/uL (ref 1.4–7.0)
Neutrophils: 61 %
Platelets: 236 10*3/uL (ref 150–450)
RBC: 4.41 x10E6/uL (ref 3.77–5.28)
RDW: 13.2 % (ref 11.7–15.4)
WBC: 6.8 10*3/uL (ref 3.4–10.8)

## 2018-07-12 LAB — CMP14+EGFR
ALT: 27 IU/L (ref 0–32)
AST: 24 IU/L (ref 0–40)
Albumin/Globulin Ratio: 1.8 (ref 1.2–2.2)
Albumin: 4.3 g/dL (ref 3.8–4.8)
Alkaline Phosphatase: 59 IU/L (ref 39–117)
BUN/Creatinine Ratio: 21 (ref 9–23)
BUN: 15 mg/dL (ref 6–20)
Bilirubin Total: 0.6 mg/dL (ref 0.0–1.2)
CO2: 23 mmol/L (ref 20–29)
Calcium: 9.7 mg/dL (ref 8.7–10.2)
Chloride: 103 mmol/L (ref 96–106)
Creatinine, Ser: 0.71 mg/dL (ref 0.57–1.00)
GFR calc Af Amer: 131 mL/min/{1.73_m2} (ref >59)
GFR calc non Af Amer: 114 mL/min/{1.73_m2} (ref >59)
Globulin, Total: 2.4 g/dL (ref 1.5–4.5)
Glucose: 68 mg/dL (ref 65–99)
Potassium: 4.6 mmol/L (ref 3.5–5.2)
Sodium: 139 mmol/L (ref 134–144)
Total Protein: 6.7 g/dL (ref 6.0–8.5)

## 2018-07-12 LAB — IGE+ALLERGENS ZONE 2(30)

## 2018-07-12 LAB — ALPHA-GAL PANEL
Alpha Gal IgE*: <0.1 kU/L (ref <0.10)
Beef (Bos spp) IgE: <0.1 kU/L (ref <0.35)
Class Interpretation: 0
Class Interpretation: 0
Class Interpretation: 0
Lamb/Mutton (Ovis spp) IgE: <0.1 kU/L (ref <0.35)
Pork (Sus spp) IgE: <0.1 kU/L (ref <0.35)

## 2018-07-12 LAB — CHRONIC URTICARIA: cu index: 6.7 (ref <10)

## 2018-07-12 LAB — ANA W/REFLEX IF POSITIVE: Anti Nuclear Antibody (ANA): NEGATIVE

## 2018-07-12 LAB — TRYPTASE: Tryptase: 3.6 ug/L (ref 2.2–13.2)

## 2018-10-16 ENCOUNTER — Other Ambulatory Visit: Payer: Self-pay

## 2018-10-16 DIAGNOSIS — Z20822 Contact with and (suspected) exposure to covid-19: Secondary | ICD-10-CM

## 2018-10-17 LAB — NOVEL CORONAVIRUS, NAA: SARS-CoV-2, NAA: NOT DETECTED

## 2018-11-27 ENCOUNTER — Other Ambulatory Visit: Payer: Self-pay

## 2018-11-27 ENCOUNTER — Ambulatory Visit (INDEPENDENT_AMBULATORY_CARE_PROVIDER_SITE_OTHER): Payer: BC Managed Care – PPO | Admitting: Certified Nurse Midwife

## 2018-11-27 ENCOUNTER — Encounter: Payer: Self-pay | Admitting: Certified Nurse Midwife

## 2018-11-27 ENCOUNTER — Encounter: Payer: BLUE CROSS/BLUE SHIELD | Admitting: Certified Nurse Midwife

## 2018-11-27 VITALS — BP 133/70 | HR 76 | Ht 62.0 in | Wt 132.2 lb

## 2018-11-27 DIAGNOSIS — Z01419 Encounter for gynecological examination (general) (routine) without abnormal findings: Secondary | ICD-10-CM

## 2018-11-27 DIAGNOSIS — Z23 Encounter for immunization: Secondary | ICD-10-CM | POA: Diagnosis not present

## 2018-11-27 NOTE — Patient Instructions (Signed)
Preventive Care 21-31 Years Old, Female Preventive care refers to visits with your health care provider and lifestyle choices that can promote health and wellness. This includes:  A yearly physical exam. This may also be called an annual well check.  Regular dental visits and eye exams.  Immunizations.  Screening for certain conditions.  Healthy lifestyle choices, such as eating a healthy diet, getting regular exercise, not using drugs or products that contain nicotine and tobacco, and limiting alcohol use. What can I expect for my preventive care visit? Physical exam Your health care provider will check your:  Height and weight. This may be used to calculate body mass index (BMI), which tells if you are at a healthy weight.  Heart rate and blood pressure.  Skin for abnormal spots. Counseling Your health care provider may ask you questions about your:  Alcohol, tobacco, and drug use.  Emotional well-being.  Home and relationship well-being.  Sexual activity.  Eating habits.  Work and work environment.  Method of birth control.  Menstrual cycle.  Pregnancy history. What immunizations do I need?  Influenza (flu) vaccine  This is recommended every year. Tetanus, diphtheria, and pertussis (Tdap) vaccine  You may need a Td booster every 10 years. Varicella (chickenpox) vaccine  You may need this if you have not been vaccinated. Human papillomavirus (HPV) vaccine  If recommended by your health care provider, you may need three doses over 6 months. Measles, mumps, and rubella (MMR) vaccine  You may need at least one dose of MMR. You may also need a second dose. Meningococcal conjugate (MenACWY) vaccine  One dose is recommended if you are age 19-21 years and a first-year college student living in a residence hall, or if you have one of several medical conditions. You may also need additional booster doses. Pneumococcal conjugate (PCV13) vaccine  You may need  this if you have certain conditions and were not previously vaccinated. Pneumococcal polysaccharide (PPSV23) vaccine  You may need one or two doses if you smoke cigarettes or if you have certain conditions. Hepatitis A vaccine  You may need this if you have certain conditions or if you travel or work in places where you may be exposed to hepatitis A. Hepatitis B vaccine  You may need this if you have certain conditions or if you travel or work in places where you may be exposed to hepatitis B. Haemophilus influenzae type b (Hib) vaccine  You may need this if you have certain conditions. You may receive vaccines as individual doses or as more than one vaccine together in one shot (combination vaccines). Talk with your health care provider about the risks and benefits of combination vaccines. What tests do I need?  Blood tests  Lipid and cholesterol levels. These may be checked every 5 years starting at age 20.  Hepatitis C test.  Hepatitis B test. Screening  Diabetes screening. This is done by checking your blood sugar (glucose) after you have not eaten for a while (fasting).  Sexually transmitted disease (STD) testing.  BRCA-related cancer screening. This may be done if you have a family history of breast, ovarian, tubal, or peritoneal cancers.  Pelvic exam and Pap test. This may be done every 3 years starting at age 21. Starting at age 30, this may be done every 5 years if you have a Pap test in combination with an HPV test. Talk with your health care provider about your test results, treatment options, and if necessary, the need for more tests.   Follow these instructions at home: Eating and drinking   Eat a diet that includes fresh fruits and vegetables, whole grains, lean protein, and low-fat dairy.  Take vitamin and mineral supplements as recommended by your health care provider.  Do not drink alcohol if: ? Your health care provider tells you not to drink. ? You are  pregnant, may be pregnant, or are planning to become pregnant.  If you drink alcohol: ? Limit how much you have to 0-1 drink a day. ? Be aware of how much alcohol is in your drink. In the [place] one drink equals one 12 oz bottle of beer (355 mL), one 5 oz glass of wine (148 mL), or one 1 oz glass of hard liquor (44 mL). Lifestyle  Take daily care of your teeth and gums.  Stay active. Exercise for at least 30 minutes on 5 or more days each week.  Do not use any products that contain nicotine or tobacco, such as cigarettes, e-cigarettes, and chewing tobacco. If you need help quitting, ask your health care provider.  If you are sexually active, practice safe sex. Use a condom or other form of birth control (contraception) in order to prevent pregnancy and STIs (sexually transmitted infections). If you plan to become pregnant, see your health care provider for a preconception visit. What's next?  Visit your health care provider once a year for a well check visit.  Ask your health care provider how often you should have your eyes and teeth checked.  Stay up to date on all vaccines. This information is not intended to replace advice given to you by your health care provider. Make sure you discuss any questions you have with your health care provider. Document Released: 04/20/2001 Document Revised: 11/03/2017 Document Reviewed: 11/03/2017 Elsevier Patient Education  2020 Elsevier Inc.  

## 2018-11-27 NOTE — Progress Notes (Signed)
GYNECOLOGY ANNUAL PREVENTATIVE CARE ENCOUNTER NOTE  History:     Renee Reyes is a 33 y.o. [id number] female here for a routine annual gynecologic exam.  Current complaints: none.   Denies abnormal vaginal bleeding, discharge, pelvic pain, problems with intercourse or other gynecologic concerns.    Gynecologic History No LMP recorded. (Menstrual status: IUD). Contraception: IUD Last Pap: 11/23/17. Results were: normal with negative HPV Last mammogram: n/a.  Obstetric History OB History  Gravida Para Term Preterm AB Living  2 2 2     2   SAB TAB Ectopic Multiple Live Births        0 2    # Outcome Date GA Lbr Len/2nd Weight Sex Delivery Anes PTL Lv  2 Term 05/18/16 [redacted]w[redacted]d / 01:11 5 lb 15.6 oz (2.71 kg) F Vag-Spont EPI  LIV  1 Term 05/21/14   6.3 oz (0.179 kg) M Vag-Spont  N LIV    Past Medical History:  Diagnosis Date  . C. difficile diarrhea   . Medical history non-contributory     Past Surgical History:  Procedure Laterality Date  . NO PAST SURGERIES      Current Outpatient Medications on File Prior to Visit  Medication Sig Dispense Refill  . cetirizine (ZYRTEC) 10 MG tablet Take 10 mg by mouth as needed for allergies.    . cetirizine (ZYRTEC) 10 MG tablet Take 1 tablet (10 mg total) by mouth daily. 30 tablet 5  . famotidine (PEPCID) 20 MG tablet Take 1 tablet (20 mg total) by mouth as needed for heartburn or indigestion. 30 tablet 5  . levonorgestrel (MIRENA) 20 MCG/24HR IUD 1 each by Intrauterine route once.     No current facility-administered medications on file prior to visit.     Allergies  Allergen Reactions  . Amoxicillin-Pot Clavulanate Diarrhea, Nausea And Vomiting and Other (See Comments)    Social History:  reports that she has never smoked. She has never used smokeless tobacco. She reports current alcohol use. She reports that she does not use drugs.  Exercise 5 x wk for 30-45 min Denies drugs , smoking, vaping Occasional alcohol.   Family History   Problem Relation Age of Onset  . Diabetes Father   . Hypertension Father   . Arthritis Maternal Grandmother   . Asthma Maternal Grandmother   . Allergic rhinitis Neg Hx   . Angioedema Neg Hx   . Eczema Neg Hx   . Immunodeficiency Neg Hx     The following portions of the patient's history were reviewed and updated as appropriate: allergies, current medications, past family history, past medical history, past social history, past surgical history and problem list.  Review of Systems Pertinent items noted in HPI and remainder of comprehensive ROS otherwise negative.  Physical Exam:  There were no vitals taken for this visit. CONSTITUTIONAL: Well-developed, well-nourished female in no acute distress.  HENT:  Normocephalic, atraumatic, External right and left ear normal. Oropharynx is clear and moist EYES: Conjunctivae and EOM are normal. Pupils are equal, round, and reactive to light. No scleral icterus.  NECK: Normal range of motion, supple, no masses.  Normal thyroid.  SKIN: Skin is warm and dry. No rash noted. Not diaphoretic. No erythema. No pallor. MUSCULOSKELETAL: Normal range of motion. No tenderness.  No cyanosis, clubbing, or edema.  2+ distal pulses. NEUROLOGIC: Alert and oriented to person, place, and time. Normal reflexes, muscle tone coordination. No cranial nerve deficit noted. PSYCHIATRIC: Normal mood and affect. Normal behavior. Normal judgment and  thought content. CARDIOVASCULAR: Normal heart rate noted, regular rhythm RESPIRATORY: Clear to auscultation bilaterally. Effort and breath sounds normal, no problems with respiration noted. BREASTS: Symmetric in size. No masses, skin changes, nipple drainage, or lymphadenopathy. ABDOMEN: Soft, normal bowel sounds, no distention noted.  No tenderness, rebound or guarding.  PELVIC: Normal appearing external genitalia; normal appearing vaginal mucosa and cervix.  No abnormal discharge noted.  Pap smear not indicated. Strings  present.   Normal uterine size, no other palpable masses, no uterine or adnexal tenderness.   Assessment and Plan:    There are no diagnoses linked to this encounter. Pap smear not indicated until 2024 Mammogram not indicated  Labs: none due Routine preventative health maintenance measures emphasized. Please refer to After Visit Summary for other counseling recommendations.   Philip Aspen, CNM

## 2019-04-02 ENCOUNTER — Ambulatory Visit: Payer: BC Managed Care – PPO | Attending: Internal Medicine

## 2019-04-02 DIAGNOSIS — Z20822 Contact with and (suspected) exposure to covid-19: Secondary | ICD-10-CM

## 2019-04-03 LAB — NOVEL CORONAVIRUS, NAA: SARS-CoV-2, NAA: NOT DETECTED

## 2019-04-10 ENCOUNTER — Ambulatory Visit: Payer: BC Managed Care – PPO | Admitting: Certified Nurse Midwife

## 2019-05-15 ENCOUNTER — Other Ambulatory Visit: Payer: Self-pay

## 2019-05-15 ENCOUNTER — Encounter: Payer: Self-pay | Admitting: Certified Nurse Midwife

## 2019-05-15 ENCOUNTER — Ambulatory Visit (INDEPENDENT_AMBULATORY_CARE_PROVIDER_SITE_OTHER): Payer: BC Managed Care – PPO | Admitting: Certified Nurse Midwife

## 2019-05-15 VITALS — BP 113/71 | HR 69 | Ht 62.0 in | Wt 134.5 lb

## 2019-05-15 DIAGNOSIS — Z30432 Encounter for removal of intrauterine contraceptive device: Secondary | ICD-10-CM | POA: Diagnosis not present

## 2019-05-15 MED ORDER — NORETHIN ACE-ETH ESTRAD-FE 1-20 MG-MCG PO TABS: 1.0000 | ORAL_TABLET | Freq: Every day | ORAL | 11 refills | Status: DC

## 2019-05-15 NOTE — Patient Instructions (Signed)

## 2019-05-15 NOTE — Progress Notes (Signed)
    GYNECOLOGY OFFICE PROCEDURE NOTE  Renee Reyes is a 34 y.o. [id number] here for IUD removal. No GYN concerns.  Last pap smear was on 11/23/17 and was normal.  IUD Removal  Patient identified, informed consent performed, consent signed.  Patient was in the dorsal lithotomy position, normal external genitalia was noted.  A speculum was placed in the patient's vagina, normal discharge was noted, no lesions. The cervix was visualized, no lesions, no abnormal discharge.  The strings of the IUD were grasped and pulled using ring forceps. The IUD was removed in its entirety. Patient tolerated the procedure well.    Patient will use ocp for contraception, she denies any contraindication to use.   Routine preventative health maintenance measures emphasized.   Philip Aspen, CNM

## 2019-05-16 ENCOUNTER — Other Ambulatory Visit: Payer: BC Managed Care – PPO

## 2019-05-21 ENCOUNTER — Ambulatory Visit: Payer: BC Managed Care – PPO | Attending: Internal Medicine

## 2019-05-21 DIAGNOSIS — Z20822 Contact with and (suspected) exposure to covid-19: Secondary | ICD-10-CM

## 2019-05-22 LAB — NOVEL CORONAVIRUS, NAA: SARS-CoV-2, NAA: NOT DETECTED

## 2019-05-29 ENCOUNTER — Other Ambulatory Visit: Payer: Self-pay

## 2019-06-01 ENCOUNTER — Ambulatory Visit: Payer: BC Managed Care – PPO | Attending: Internal Medicine

## 2019-06-01 DIAGNOSIS — Z23 Encounter for immunization: Secondary | ICD-10-CM

## 2019-06-01 NOTE — Progress Notes (Signed)
   Covid-19 Vaccination Clinic  Name:  Renee Reyes    MRN: [id number] DOB: [birth year]  06/01/2019  [name] was observed post Covid-19 immunization for 15 minutes without incident. She was provided with Vaccine Information Sheet and instruction to access the V-Safe system.   [name] was instructed to call 911 with any severe reactions post vaccine: Marland Kitchen Difficulty breathing  . Swelling of face and throat  . A fast heartbeat  . A bad rash all over body  . Dizziness and weakness   Immunizations Administered    Name Date Dose VIS Date Route   Pfizer COVID-19 Vaccine 06/01/2019  9:22 AM 0.3 mL 02/16/2019 Intramuscular   Manufacturer: [place]   Lot: [id number]   [place]: [id number]

## 2019-06-07 ENCOUNTER — Other Ambulatory Visit: Payer: Self-pay

## 2019-06-07 MED ORDER — NORETHIN ACE-ETH ESTRAD-FE 1-20 MG-MCG PO TABS: 1.0000 | ORAL_TABLET | Freq: Every day | ORAL | 3 refills | Status: DC

## 2019-06-07 NOTE — Telephone Encounter (Signed)
Faxed request received from [facility] for a 90 day supply of OCP with 3 refills. Sent to pharmacy

## 2019-06-26 ENCOUNTER — Ambulatory Visit: Payer: BC Managed Care – PPO | Attending: Internal Medicine

## 2019-06-26 DIAGNOSIS — Z23 Encounter for immunization: Secondary | ICD-10-CM

## 2019-06-26 NOTE — Progress Notes (Signed)
   Covid-[facility] Vaccination [facility]  Name:  Renee Reyes    MRN: [id number] DOB: Aug 20, 1985  06/26/2019  [name] was observed post Covid-19 immunization for 15 minutes without incident. She was provided with Vaccine Information Sheet and instruction to access the V-Safe system.   [name] was instructed to call 911 with any severe reactions post vaccine: Marland Kitchen Difficulty breathing  . Swelling of face and throat  . A fast heartbeat  . A bad rash all over body  . Dizziness and weakness   Immunizations Administered    Name Date Dose VIS Date Route   Pfizer COVID-19 Vaccine 06/26/2019  8:40 AM 0.3 mL 05/02/2018 Intramuscular   Manufacturer: [place]   Lot: [id number]   [place]: [id number]

## 2019-08-18 IMAGING — MR MR HEAD W/O CM
12 series · 41 of 48 positions shown · non-contrast
Comparison: None.

CLINICAL DATA: Left-sided facial droop and facial asymmetry. Facial
nerve paralysis.

EXAM:
MRI HEAD WITHOUT CONTRAST
TECHNIQUE: Multiplanar, multiecho pulse sequences of the brain and surrounding
structures were obtained without intravenous contrast.

[Series 5: ax dwi_tracew · axial · 3.0mm · 0.60mm/px · z∈[-59,+102]mm · 5 of 55 slices shown]
[im 1/55]
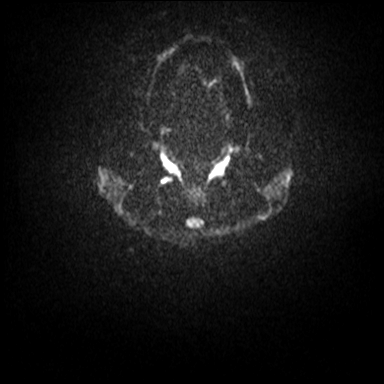
[im 14/55]
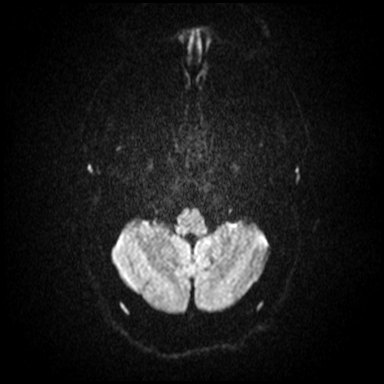
[im 28/55]
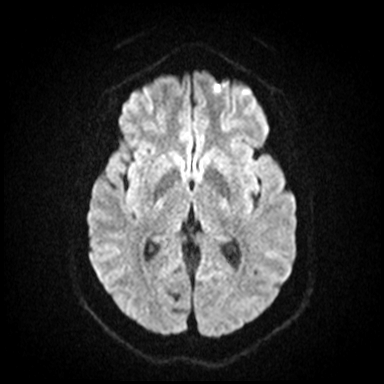
[im 41/55]
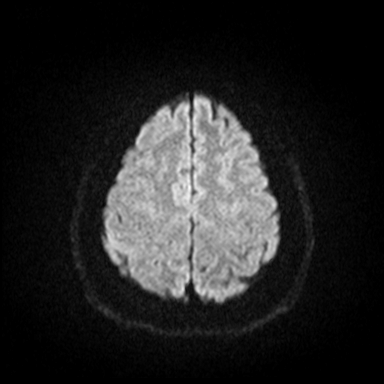
[im 55/55]
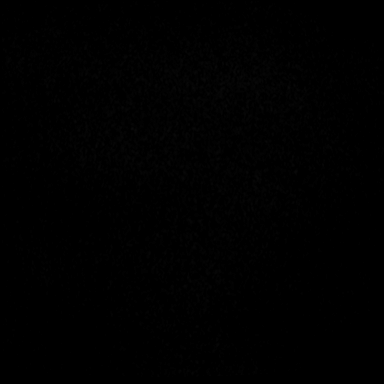

[Series 6: ax dwi_adc · axial · 3.0mm · 0.60mm/px · z∈[-59,+99]mm · 4 of 54 slices shown]
[im 1/54]
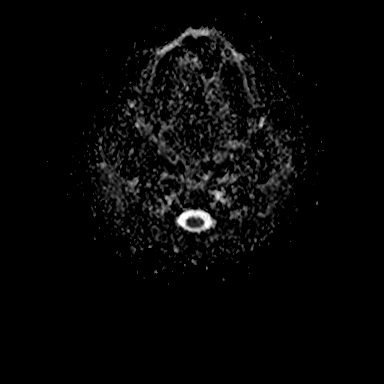
[im 18/54]
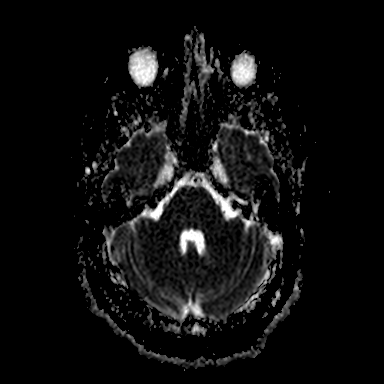
[im 36/54]
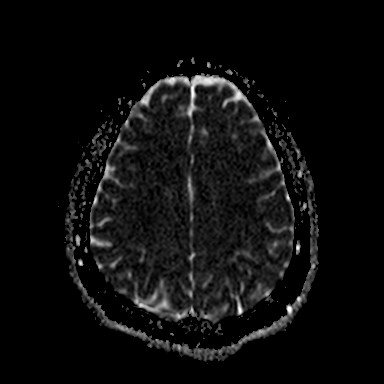
[im 54/54]
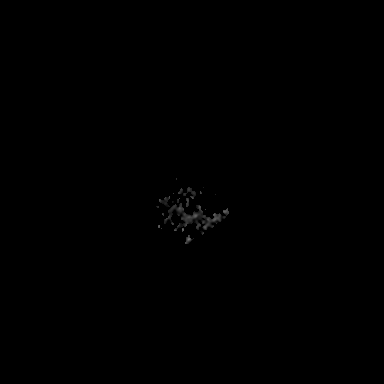

[Series 7: cor dwi_tracew · coronal · 5.0mm · 0.60mm/px · 3 of 45 slices shown (1 of 2)]
[im 1/45]
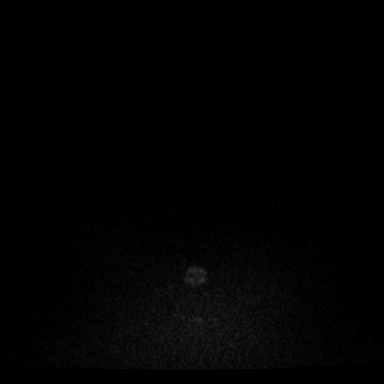
[im 23/45]
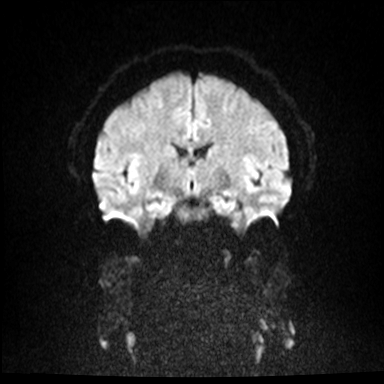
[im 45/45]
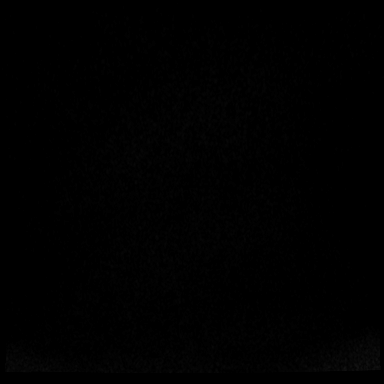

[Series 7: cor dwi_tracew · coronal · 5.0mm · 0.60mm/px · 3 of 45 slices shown (2 of 2)]
[im 1/45]
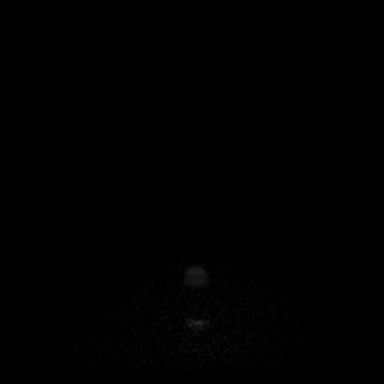
[im 23/45]
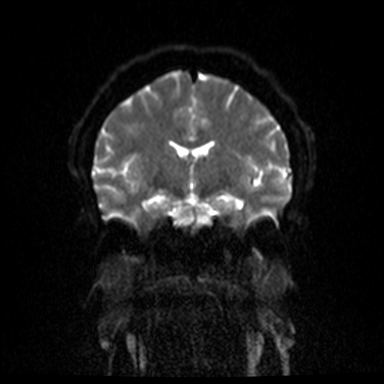
[im 45/45]
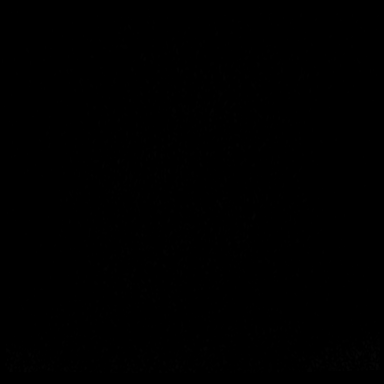

[Series 8: cor dwi_adc · coronal · 5.0mm · 0.60mm/px · 3 of 43 slices shown]
[im 1/43]
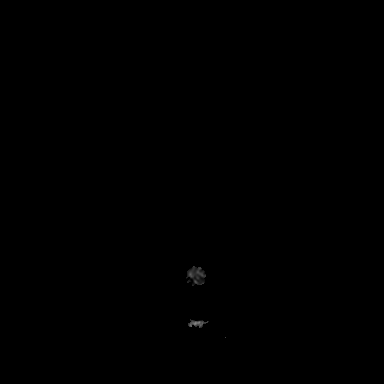
[im 22/43]
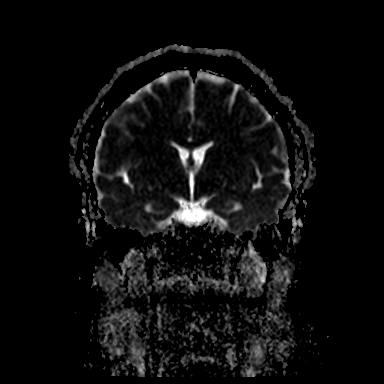
[im 43/43]
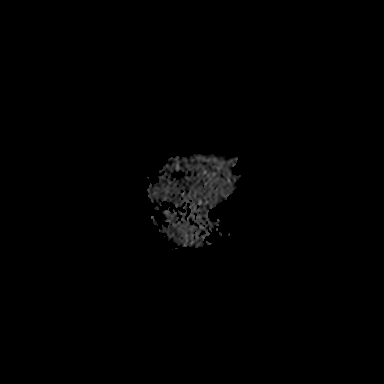

[Series 9: T1 · sagittal · 5.0mm · 0.62mm/px · 2 of 24 slices shown (1 of 2)]
[im 1/24]
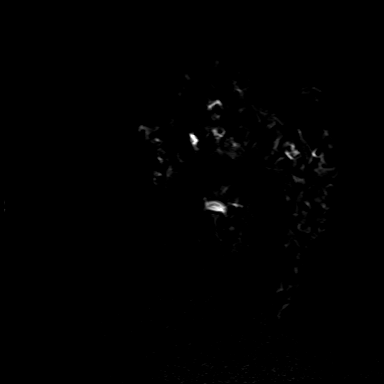
[im 24/24]
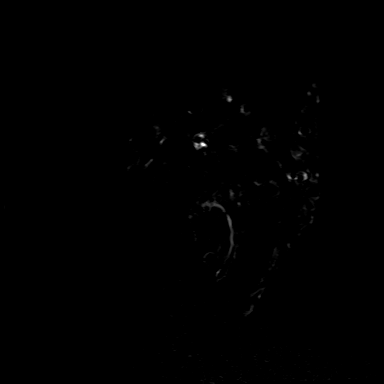

[Series 10: T2 · axial · 5.0mm · 0.53mm/px · z∈[-48,+95]mm · 2 of 25 slices shown (1 of 2)]
[im 1/25]
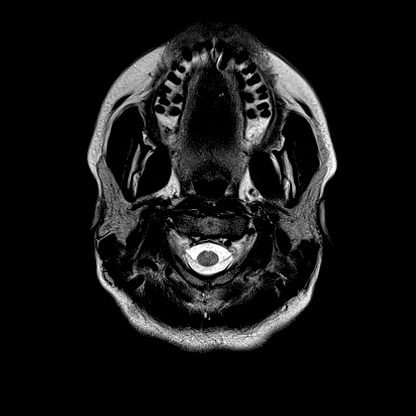
[im 25/25]
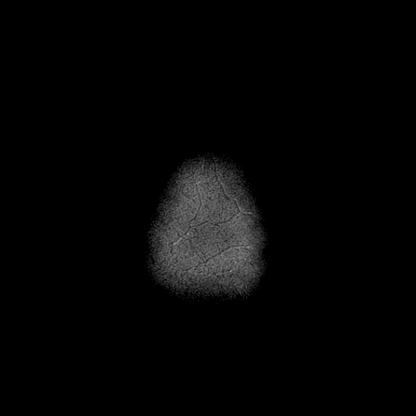

[Series 12: pha_images · axial · 3.0mm · 0.90mm/px · z∈[-66,+107]mm · 4 of 59 slices shown]
[im 1/59]
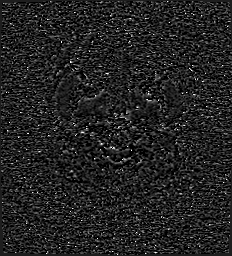
[im 20/59]
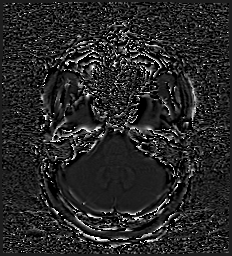
[im 39/59]
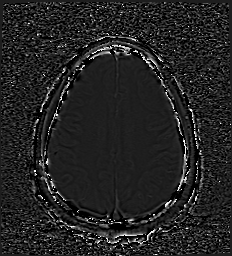
[im 59/59]
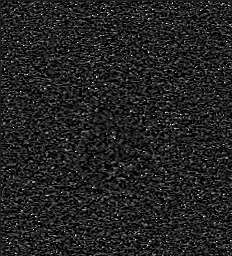

[Series 13: swi_images · axial · 3.0mm · 0.90mm/px · 1 of 60 slices shown]
[im 1/60]
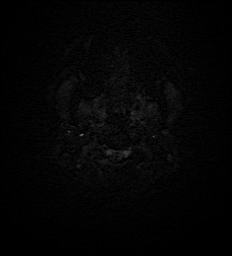

[Series 15: FLAIR · axial · 3.0mm · 0.53mm/px · z∈[-57,+104]mm · 4 of 55 slices shown]
[im 1/55]
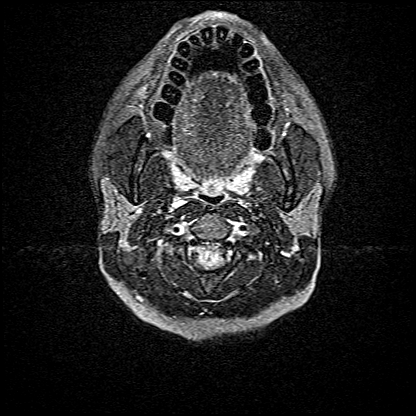
[im 19/55]
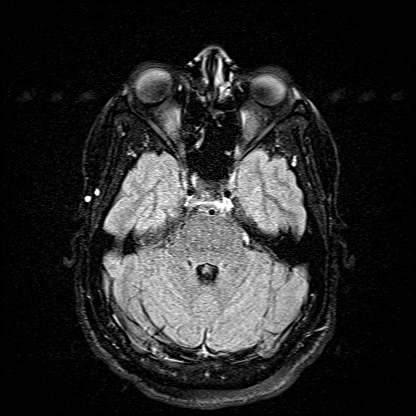
[im 37/55]
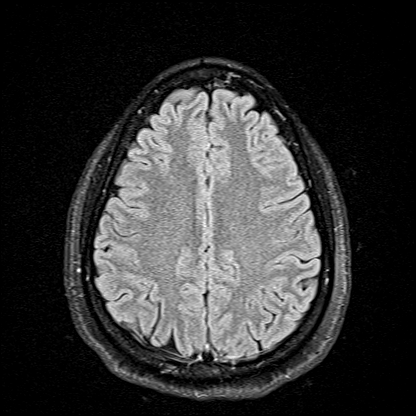
[im 55/55]
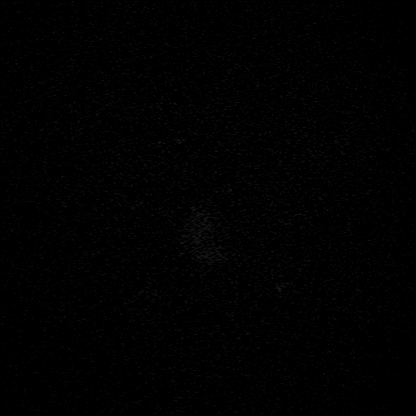

[Series 16: T1 · axial · 1.0mm · 0.98mm/px · z∈[-63,+109]mm · 8 of 172 slices shown (2 of 2)]
[im 1/172]
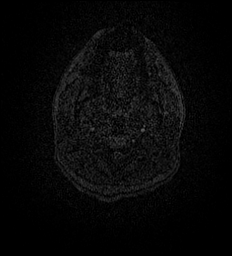
[im 32/172]
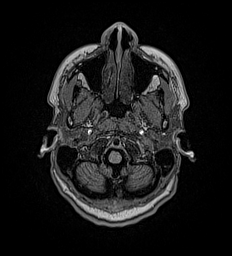
[im 47/172]
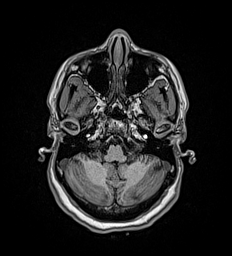
[im 78/172]
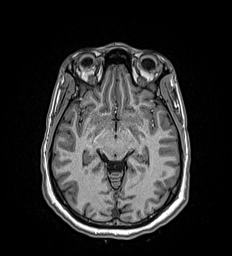
[im 94/172]
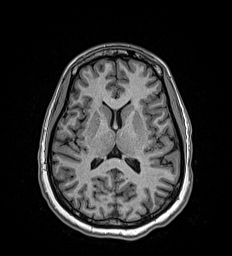
[im 125/172]
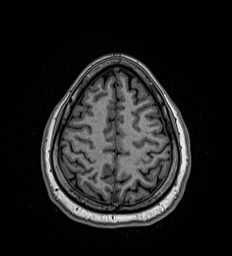
[im 140/172]
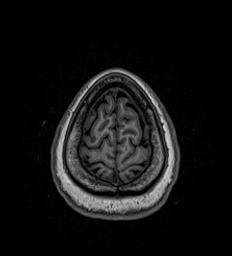
[im 172/172]
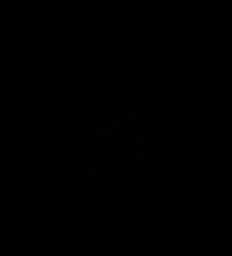

[Series 17: T2 · coronal · 5.0mm · 0.57mm/px · 2 of 29 slices shown (2 of 2)]
[im 1/29]
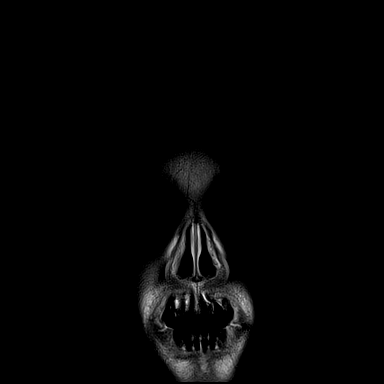
[im 29/29]
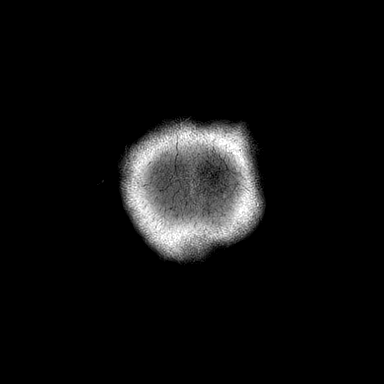

[41 of 48 positions shown; findings below may reference images not displayed]

FINDINGS: Brain: The diffusion-weighted images demonstrate no acute or
subacute infarction. No hemorrhage or mass lesion is present.

Vascular: Flow is present in the major intracranial arteries.

Skull and upper cervical spine: The craniocervical junction is
normal. Upper cervical spine is within normal limits. Marrow signal
is unremarkable.

Sinuses/Orbits: The paranasal sinuses and mastoid air cells are
clear. The globes and orbits are within normal limits.
IMPRESSION: Negative MRI the brain. No acute or focal lesion to explain facial
droop or asymmetry.

## 2019-11-28 ENCOUNTER — Encounter: Payer: BC Managed Care – PPO | Admitting: Certified Nurse Midwife

## 2019-12-03 ENCOUNTER — Encounter: Payer: BC Managed Care – PPO | Admitting: Certified Nurse Midwife

## 2019-12-11 ENCOUNTER — Ambulatory Visit (INDEPENDENT_AMBULATORY_CARE_PROVIDER_SITE_OTHER): Payer: BC Managed Care – PPO | Admitting: Certified Nurse Midwife

## 2019-12-11 ENCOUNTER — Encounter: Payer: Self-pay | Admitting: Certified Nurse Midwife

## 2019-12-11 ENCOUNTER — Other Ambulatory Visit: Payer: Self-pay

## 2019-12-11 VITALS — BP 137/97 | HR 68 | Ht 62.0 in | Wt 134.4 lb

## 2019-12-11 DIAGNOSIS — Z23 Encounter for immunization: Secondary | ICD-10-CM | POA: Diagnosis not present

## 2019-12-11 DIAGNOSIS — Z01419 Encounter for gynecological examination (general) (routine) without abnormal findings: Secondary | ICD-10-CM

## 2019-12-11 DIAGNOSIS — Z1159 Encounter for screening for other viral diseases: Secondary | ICD-10-CM

## 2019-12-11 MED ORDER — NORETHIN ACE-ETH ESTRAD-FE 1-20 MG-MCG PO TABS: 1.0000 | ORAL_TABLET | Freq: Every day | ORAL | 4 refills | Status: DC

## 2019-12-11 NOTE — Progress Notes (Signed)
GYNECOLOGY ANNUAL PREVENTATIVE CARE ENCOUNTER NOTE  History:     Renee Reyes is a 34 y.o. G60P2002 female here for a routine annual gynecologic exam.  Current complaints: none.   Denies abnormal vaginal bleeding, discharge, pelvic pain, problems with intercourse or other gynecologic concerns.     Social Relationship:married  Living:partner and children  Work:engineer Exercise:5 x wk cardio & strength training 45 min.  Smoke/Alcohol/drug WRU:[id number] alcohol   Gynecologic History No LMP recorded. (Menstrual status: IUD). Contraception: OCP (estrogen/progesterone) Last Pap:11/23/17. Results were: normal with negative HPV Last mammogram: n/a  [facility] - 12/11/19 1506      Pregnancy Intention Screening   Does the patient want to become pregnant in the next year? No    Does the patient's partner want to become pregnant in the next year? No    Would the patient like to discuss contraceptive options today? No      Contraception Wrap Up   Current Method Oral Contraceptive    Contraception Counseling Provided No          The pregnancy intention screening data noted above was reviewed. Potential methods of contraception were discussed. The patient elected to proceed with Oral Contraceptive.    Obstetric History OB History  Gravida Para Term Preterm AB Living  2 2 2     2   SAB TAB Ectopic Multiple Live Births        0 2    # Outcome Date GA Lbr Len/2nd Weight Sex Delivery Anes PTL Lv  2 Term 05/18/16 [redacted]w[redacted]d / 01:11 5 lb 15.6 oz (2.71 kg) F Vag-Spont EPI  LIV  1 Term 05/21/14   6.3 oz (0.179 kg) M Vag-Spont  N LIV    Past Medical History:  Diagnosis Date  . C. difficile diarrhea   . Medical history non-contributory     Past Surgical History:  Procedure Laterality Date  . NO PAST SURGERIES      Current Outpatient Medications on File Prior to Visit  Medication Sig Dispense Refill  . cetirizine (ZYRTEC) 10 MG tablet Take 1 tablet (10 mg total) by mouth daily. 30  tablet 5  . famotidine (PEPCID) 20 MG tablet Take 1 tablet (20 mg total) by mouth as needed for heartburn or indigestion. 30 tablet 5  . norethindrone-ethinyl estradiol (LOESTRIN FE) 1-20 MG-MCG tablet Take 1 tablet by mouth daily. 3 Package 3   No current facility-administered medications on file prior to visit.    Allergies  Allergen Reactions  . Amoxicillin-Pot Clavulanate Diarrhea, Nausea And Vomiting and Other (See Comments)    Social History:  reports that she has never smoked. She has never used smokeless tobacco. She reports current alcohol use. She reports that she does not use drugs.  Family History  Problem Relation Age of Onset  . Diabetes Father   . Hypertension Father   . Arthritis Maternal Grandmother   . Asthma Maternal Grandmother   . Allergic rhinitis Neg Hx   . Angioedema Neg Hx   . Eczema Neg Hx   . Immunodeficiency Neg Hx     The following portions of the patient's history were reviewed and updated as appropriate: allergies, current medications, past family history, past medical history, past social history, past surgical history and problem list.  Review of Systems Pertinent items noted in HPI and remainder of comprehensive ROS otherwise negative.  Physical Exam:  BP (!) 137/97   Pulse 68   Ht 5\' 2"  (1.575 m)   Wt 134  lb 6 oz (61 kg)   BMI 24.58 kg/m  CONSTITUTIONAL: Well-developed, well-nourished female in no acute distress.  HENT:  Normocephalic, atraumatic, External right and left ear normal. Oropharynx is clear and moist EYES: Conjunctivae and EOM are normal. Pupils are equal, round, and reactive to light. No scleral icterus.  NECK: Normal range of motion, supple, no masses.  Normal thyroid.  SKIN: Skin is warm and dry. No rash noted. Not diaphoretic. No erythema. No pallor. MUSCULOSKELETAL: Normal range of motion. No tenderness.  No cyanosis, clubbing, or edema.  2+ distal pulses. NEUROLOGIC: Alert and oriented to person, place, and time. Normal  reflexes, muscle tone coordination.  PSYCHIATRIC: Normal mood and affect. Normal behavior. Normal judgment and thought content. CARDIOVASCULAR: Normal heart rate noted, regular rhythm RESPIRATORY: Clear to auscultation bilaterally. Effort and breath sounds normal, no problems with respiration noted. BREASTS: Symmetric in size. No masses, tenderness, skin changes, nipple drainage, or lymphadenopathy bilaterally.  ABDOMEN: Soft, no distention noted.  No tenderness, rebound or guarding.  PELVIC: Normal appearing external genitalia and urethral meatus; normal appearing vaginal mucosa and cervix.  No abnormal discharge noted.  Pap smear not due.  Normal uterine size, no other palpable masses, no uterine or adnexal tenderness.  .   Assessment and Plan:    1. Women's annual routine gynecological examination Pap: not due Mammogram :n/a  Labs:Hep C Refills/orders:flu vaccine  Referral:none Routine preventative health maintenance measures emphasized. Please refer to After Visit Summary for other counseling recommendations.      Philip Aspen, CNM [facility]

## 2019-12-11 NOTE — Patient Instructions (Signed)
Preventive Care 21-32 Years Old, Female Preventive care refers to visits with your health care provider and lifestyle choices that can promote health and wellness. This includes:  A yearly physical exam. This may also be called an annual well check.  Regular dental visits and eye exams.  Immunizations.  Screening for certain conditions.  Healthy lifestyle choices, such as eating a healthy diet, getting regular exercise, not using drugs or products that contain nicotine and tobacco, and limiting alcohol use. What can I expect for my preventive care visit? Physical exam Your health care provider will check your:  Height and weight. This may be used to calculate body mass index (BMI), which tells if you are at a healthy weight.  Heart rate and blood pressure.  Skin for abnormal spots. Counseling Your health care provider may ask you questions about your:  Alcohol, tobacco, and drug use.  Emotional well-being.  Home and relationship well-being.  Sexual activity.  Eating habits.  Work and work environment.  Method of birth control.  Menstrual cycle.  Pregnancy history. What immunizations do I need?  Influenza (flu) vaccine  This is recommended every year. Tetanus, diphtheria, and pertussis (Tdap) vaccine  You may need a Td booster every 10 years. Varicella (chickenpox) vaccine  You may need this if you have not been vaccinated. Human papillomavirus (HPV) vaccine  If recommended by your health care provider, you may need three doses over 6 months. Measles, mumps, and rubella (MMR) vaccine  You may need at least one dose of MMR. You may also need a second dose. Meningococcal conjugate (MenACWY) vaccine  One dose is recommended if you are age 19-21 years and a first-year college student living in a residence hall, or if you have one of several medical conditions. You may also need additional booster doses. Pneumococcal conjugate (PCV13) vaccine  You may need  this if you have certain conditions and were not previously vaccinated. Pneumococcal polysaccharide (PPSV23) vaccine  You may need one or two doses if you smoke cigarettes or if you have certain conditions. Hepatitis A vaccine  You may need this if you have certain conditions or if you travel or work in places where you may be exposed to hepatitis A. Hepatitis B vaccine  You may need this if you have certain conditions or if you travel or work in places where you may be exposed to hepatitis B. Haemophilus influenzae type b (Hib) vaccine  You may need this if you have certain conditions. You may receive vaccines as individual doses or as more than one vaccine together in one shot (combination vaccines). Talk with your health care provider about the risks and benefits of combination vaccines. What tests do I need?  Blood tests  Lipid and cholesterol levels. These may be checked every 5 years starting at age 20.  Hepatitis C test.  Hepatitis B test. Screening  Diabetes screening. This is done by checking your blood sugar (glucose) after you have not eaten for a while (fasting).  Sexually transmitted disease (STD) testing.  BRCA-related cancer screening. This may be done if you have a family history of breast, ovarian, tubal, or peritoneal cancers.  Pelvic exam and Pap test. This may be done every 3 years starting at age 21. Starting at age 30, this may be done every 5 years if you have a Pap test in combination with an HPV test. Talk with your health care provider about your test results, treatment options, and if necessary, the need for more tests.   Follow these instructions at home: Eating and drinking   Eat a diet that includes fresh fruits and vegetables, whole grains, lean protein, and low-fat dairy.  Take vitamin and mineral supplements as recommended by your health care provider.  Do not drink alcohol if: ? Your health care provider tells you not to drink. ? You are  pregnant, may be pregnant, or are planning to become pregnant.  If you drink alcohol: ? Limit how much you have to 0-1 drink a day. ? Be aware of how much alcohol is in your drink. In the [place] one drink equals one 12 oz bottle of beer (355 mL), one 5 oz glass of wine (148 mL), or one 1 oz glass of hard liquor (44 mL). Lifestyle  Take daily care of your teeth and gums.  Stay active. Exercise for at least 30 minutes on 5 or more days each week.  Do not use any products that contain nicotine or tobacco, such as cigarettes, e-cigarettes, and chewing tobacco. If you need help quitting, ask your health care provider.  If you are sexually active, practice safe sex. Use a condom or other form of birth control (contraception) in order to prevent pregnancy and STIs (sexually transmitted infections). If you plan to become pregnant, see your health care provider for a preconception visit. What's next?  Visit your health care provider once a year for a well check visit.  Ask your health care provider how often you should have your eyes and teeth checked.  Stay up to date on all vaccines. This information is not intended to replace advice given to you by your health care provider. Make sure you discuss any questions you have with your health care provider. Document Revised: 11/03/2017 Document Reviewed: 11/03/2017 Elsevier Patient Education  2020 [employer].

## 2019-12-12 LAB — HEPATITIS C ANTIBODY: Hep C Virus Ab: <0.1 s/co ratio (ref 0.0–0.9)

## 2020-08-07 ENCOUNTER — Other Ambulatory Visit: Payer: Self-pay | Admitting: Certified Nurse Midwife

## 2020-08-07 NOTE — Telephone Encounter (Signed)
See request. Thanks, JML

## 2021-06-19 ENCOUNTER — Other Ambulatory Visit: Payer: Self-pay | Admitting: Certified Nurse Midwife

## 2021-09-15 ENCOUNTER — Other Ambulatory Visit: Payer: Self-pay | Admitting: Certified Nurse Midwife

## 2021-10-30 ENCOUNTER — Other Ambulatory Visit: Payer: Self-pay | Admitting: Obstetrics and Gynecology

## 2021-10-30 ENCOUNTER — Other Ambulatory Visit (HOSPITAL_COMMUNITY)
Admission: RE | Admit: 2021-10-30 | Discharge: 2021-10-30 | Disposition: A | Discharge status: Home / Self Care | Payer: BC Managed Care – PPO | Source: Ambulatory Visit | Attending: Obstetrics and Gynecology | Admitting: Obstetrics and Gynecology

## 2021-10-30 DIAGNOSIS — Z01419 Encounter for gynecological examination (general) (routine) without abnormal findings: Secondary | ICD-10-CM | POA: Insufficient documentation

## 2021-11-04 LAB — CYTOLOGY - PAP
Comment: NEGATIVE
Diagnosis: NEGATIVE
High risk HPV: NEGATIVE

## 2021-12-16 ENCOUNTER — Other Ambulatory Visit: Payer: Self-pay | Admitting: Certified Nurse Midwife

## 2021-12-17 ENCOUNTER — Other Ambulatory Visit: Payer: Self-pay | Admitting: Certified Nurse Midwife
